# Patient Record
Sex: Female | Born: 1975
Health system: Southern US, Community
[De-identification: ages and names within clinical notes are randomized; demographics above are authoritative.]

## PROBLEM LIST (undated history)

## (undated) DIAGNOSIS — F32A Depression, unspecified: Secondary | ICD-10-CM

## (undated) DIAGNOSIS — Z872 Personal history of diseases of the skin and subcutaneous tissue: Secondary | ICD-10-CM

## (undated) DIAGNOSIS — F3281 Premenstrual dysphoric disorder: Secondary | ICD-10-CM

## (undated) DIAGNOSIS — Z87891 Personal history of nicotine dependence: Secondary | ICD-10-CM

## (undated) DIAGNOSIS — F419 Anxiety disorder, unspecified: Secondary | ICD-10-CM

## (undated) DIAGNOSIS — F329 Major depressive disorder, single episode, unspecified: Secondary | ICD-10-CM

## (undated) DIAGNOSIS — G43909 Migraine, unspecified, not intractable, without status migrainosus: Secondary | ICD-10-CM

## (undated) DIAGNOSIS — L858 Other specified epidermal thickening: Secondary | ICD-10-CM

## (undated) HISTORY — DX: Migraine, unspecified, not intractable, without status migrainosus: G43.909

## (undated) HISTORY — DX: Personal history of diseases of the skin and subcutaneous tissue: Z87.2

## (undated) HISTORY — DX: Personal history of nicotine dependence: Z87.891

## (undated) HISTORY — DX: Other specified epidermal thickening: L85.8

## (undated) HISTORY — DX: Premenstrual dysphoric disorder: F32.81

## (undated) HISTORY — PX: WISDOM TOOTH EXTRACTION: SHX21

---

## 2004-08-13 ENCOUNTER — Emergency Department (HOSPITAL_COMMUNITY): Admission: EM | Admit: 2004-08-13 | Discharge: 2004-08-13 | Payer: Self-pay | Admitting: Emergency Medicine

## 2005-08-31 ENCOUNTER — Ambulatory Visit: Payer: Self-pay | Admitting: Family Medicine

## 2005-09-09 ENCOUNTER — Encounter: Payer: Self-pay | Admitting: Family Medicine

## 2005-09-09 ENCOUNTER — Other Ambulatory Visit: Admission: RE | Admit: 2005-09-09 | Discharge: 2005-09-09 | Payer: Self-pay | Admitting: Family Medicine

## 2005-09-09 ENCOUNTER — Ambulatory Visit: Payer: Self-pay | Admitting: Family Medicine

## 2005-09-09 LAB — CONVERTED CEMR LAB: Pap Smear: NORMAL

## 2006-01-13 ENCOUNTER — Ambulatory Visit: Payer: Self-pay | Admitting: Family Medicine

## 2006-03-11 ENCOUNTER — Encounter: Payer: Self-pay | Admitting: Family Medicine

## 2006-10-08 ENCOUNTER — Encounter: Payer: Self-pay | Admitting: Family Medicine

## 2006-10-08 DIAGNOSIS — L708 Other acne: Secondary | ICD-10-CM | POA: Insufficient documentation

## 2006-10-08 DIAGNOSIS — Z87898 Personal history of other specified conditions: Secondary | ICD-10-CM | POA: Insufficient documentation

## 2006-10-08 DIAGNOSIS — Z87891 Personal history of nicotine dependence: Secondary | ICD-10-CM | POA: Insufficient documentation

## 2006-10-11 ENCOUNTER — Ambulatory Visit: Payer: Self-pay | Admitting: Family Medicine

## 2007-03-25 ENCOUNTER — Ambulatory Visit: Payer: Self-pay | Admitting: Family Medicine

## 2007-05-09 ENCOUNTER — Telehealth: Payer: Self-pay | Admitting: Family Medicine

## 2007-07-14 ENCOUNTER — Encounter: Payer: Self-pay | Admitting: Family Medicine

## 2007-07-14 ENCOUNTER — Other Ambulatory Visit: Admission: RE | Admit: 2007-07-14 | Discharge: 2007-07-14 | Payer: Self-pay | Admitting: Family Medicine

## 2007-07-14 ENCOUNTER — Ambulatory Visit: Payer: Self-pay | Admitting: Family Medicine

## 2007-07-14 DIAGNOSIS — F39 Unspecified mood [affective] disorder: Secondary | ICD-10-CM | POA: Insufficient documentation

## 2007-07-14 DIAGNOSIS — N920 Excessive and frequent menstruation with regular cycle: Secondary | ICD-10-CM | POA: Insufficient documentation

## 2007-07-18 LAB — CONVERTED CEMR LAB
Basophils Absolute: 0.1 10*3/uL (ref 0.0–0.1)
Basophils Relative: 0.6 % (ref 0.0–1.0)
Eosinophils Absolute: 0.2 10*3/uL (ref 0.0–0.6)
Eosinophils Relative: 1.8 % (ref 0.0–5.0)
HCT: 41.3 % (ref 36.0–46.0)
MCV: 91 fL (ref 78.0–100.0)
Monocytes Absolute: 0.5 10*3/uL (ref 0.2–0.7)
Neutro Abs: 6.9 10*3/uL (ref 1.4–7.7)
Platelets: 233 10*3/uL (ref 150–400)
RBC: 4.54 M/uL (ref 3.87–5.11)
WBC: 9.6 10*3/uL (ref 4.5–10.5)

## 2007-07-20 ENCOUNTER — Encounter (INDEPENDENT_AMBULATORY_CARE_PROVIDER_SITE_OTHER): Payer: Self-pay | Admitting: *Deleted

## 2008-01-06 ENCOUNTER — Telehealth (INDEPENDENT_AMBULATORY_CARE_PROVIDER_SITE_OTHER): Payer: Self-pay | Admitting: *Deleted

## 2008-03-05 ENCOUNTER — Ambulatory Visit: Payer: Self-pay | Admitting: Family Medicine

## 2008-07-19 ENCOUNTER — Telehealth: Payer: Self-pay | Admitting: Family Medicine

## 2008-07-24 ENCOUNTER — Encounter: Payer: Self-pay | Admitting: Family Medicine

## 2008-07-25 ENCOUNTER — Ambulatory Visit: Payer: Self-pay | Admitting: Family Medicine

## 2008-07-25 ENCOUNTER — Encounter: Payer: Self-pay | Admitting: Family Medicine

## 2008-07-25 ENCOUNTER — Other Ambulatory Visit: Admission: RE | Admit: 2008-07-25 | Discharge: 2008-07-25 | Payer: Self-pay | Admitting: Family Medicine

## 2008-07-25 DIAGNOSIS — N943 Premenstrual tension syndrome: Secondary | ICD-10-CM | POA: Insufficient documentation

## 2008-07-26 ENCOUNTER — Telehealth: Payer: Self-pay | Admitting: Family Medicine

## 2008-07-27 LAB — CONVERTED CEMR LAB
AST: 18 units/L (ref 0–37)
Albumin: 4.1 g/dL (ref 3.5–5.2)
Alkaline Phosphatase: 56 units/L (ref 39–117)
BUN: 10 mg/dL (ref 6–23)
Basophils Absolute: 0 10*3/uL (ref 0.0–0.1)
Basophils Relative: 0.3 % (ref 0.0–3.0)
Creatinine, Ser: 0.7 mg/dL (ref 0.4–1.2)
Eosinophils Relative: 0.8 % (ref 0.0–5.0)
HCT: 41 % (ref 36.0–46.0)
HDL: 64.8 mg/dL (ref >39.00)
Hemoglobin: 14.1 g/dL (ref 12.0–15.0)
Lymphocytes Relative: 35.4 % (ref 12.0–46.0)
MCV: 90.3 fL (ref 78.0–100.0)
Neutrophils Relative %: 58.9 % (ref 43.0–77.0)
Platelets: 242 10*3/uL (ref 150.0–400.0)
RBC: 4.54 M/uL (ref 3.87–5.11)
RDW: 11.7 % (ref 11.5–14.6)
TSH: 1.19 microintl units/mL (ref 0.35–5.50)
Total CHOL/HDL Ratio: 3
Total Protein: 7.7 g/dL (ref 6.0–8.3)
Triglycerides: 121 mg/dL (ref 0.0–149.0)
WBC: 7.4 10*3/uL (ref 4.5–10.5)

## 2008-07-30 ENCOUNTER — Encounter (INDEPENDENT_AMBULATORY_CARE_PROVIDER_SITE_OTHER): Payer: Self-pay | Admitting: *Deleted

## 2008-08-23 ENCOUNTER — Telehealth: Payer: Self-pay | Admitting: Family Medicine

## 2008-12-14 ENCOUNTER — Telehealth: Payer: Self-pay | Admitting: Family Medicine

## 2009-03-13 ENCOUNTER — Ambulatory Visit: Payer: Self-pay | Admitting: Family Medicine

## 2009-03-13 DIAGNOSIS — M26629 Arthralgia of temporomandibular joint, unspecified side: Secondary | ICD-10-CM | POA: Insufficient documentation

## 2009-04-17 ENCOUNTER — Telehealth: Payer: Self-pay | Admitting: Family Medicine

## 2009-05-21 ENCOUNTER — Telehealth: Payer: Self-pay | Admitting: Family Medicine

## 2009-07-26 ENCOUNTER — Ambulatory Visit: Payer: Self-pay | Admitting: Family Medicine

## 2009-07-26 ENCOUNTER — Other Ambulatory Visit: Admission: RE | Admit: 2009-07-26 | Discharge: 2009-07-26 | Payer: Self-pay | Admitting: Family Medicine

## 2009-07-26 LAB — HM PAP SMEAR

## 2009-08-01 ENCOUNTER — Encounter (INDEPENDENT_AMBULATORY_CARE_PROVIDER_SITE_OTHER): Payer: Self-pay | Admitting: *Deleted

## 2009-08-01 LAB — CONVERTED CEMR LAB: Pap Smear: NEGATIVE

## 2009-09-03 ENCOUNTER — Telehealth: Payer: Self-pay | Admitting: Family Medicine

## 2009-11-05 ENCOUNTER — Telehealth: Payer: Self-pay | Admitting: Family Medicine

## 2010-01-03 ENCOUNTER — Telehealth: Payer: Self-pay | Admitting: Family Medicine

## 2010-01-31 ENCOUNTER — Telehealth: Payer: Self-pay | Admitting: Family Medicine

## 2010-03-05 ENCOUNTER — Telehealth: Payer: Self-pay | Admitting: Family Medicine

## 2010-04-01 ENCOUNTER — Telehealth: Payer: Self-pay | Admitting: Family Medicine

## 2010-06-03 NOTE — Progress Notes (Signed)
SummaryPrudy Hamilton  Phone Note Refill Request Message from:  [phone] on January 31, 2010 4:29 PM  Refills Requested: Medication #1:  XANAX 0.5 MG TABS 1/2 to 1 by mouth once daily as needed severe anxiety.   Last Refilled: 01/03/2010  Method Requested: Telephone to Pharmacy Initial call taken by: Mervin Hack CMA Duncan Dull),  January 31, 2010 4:29 PM  Follow-up for Phone Call        px written on EMR for call in  Follow-up by: Judith Part MD,  January 31, 2010 4:39 PM  Additional Follow-up for Phone Call Additional follow up Details #1::        [facility]. Lewanda Rife LPN  January 31, 2010 5:01 PM     Prescriptions: XANAX 0.5 MG TABS (ALPRAZOLAM) 1/2 to 1 by mouth once daily as needed severe anxiety  #30 x 0   Entered and Authorized by:   Judith Part MD   Signed by:   Lewanda Rife LPN on 62/13/0865   Method used:   Telephoned to ...       [facility]       [address], [address]       [place], [place]  [address]       Ph: [phone]       Fax: [phone]   RxID:   [id number]

## 2010-06-03 NOTE — Progress Notes (Signed)
Summary: Rx Prozac  Phone Note Call from Patient Call back at [phone] ext [phone]   Caller: Patient Call For: Judith Part MD Summary of Call: Received call from patient.  She says when she went to pick up Rx for Prozac she had no further refills.  She comes in for a physical in March with Dr. Milinda Antis and wanted to know if she could have enough refills to last her until then.  Please advise.  [facility]ses [facility]. Initial call taken by: Linde Gillis CMA Duncan Dull),  May 21, 2009 12:52 PM  Follow-up for Phone Call        px written on EMR for call in  Follow-up by: Judith Part MD,  May 21, 2009 1:18 PM  Additional Follow-up for Phone Call Additional follow up Details #1::        [facility] Additional Follow-up by: Lowella Petties CMA,  May 21, 2009 3:39 PM    Prescriptions: PROZAC 20 MG CAPS (FLUOXETINE HCL) 1 by mouth once daily  #30 x 3   Entered and Authorized by:   Judith Part MD   Signed by:   Judith Part MD on 05/21/2009   Method used:   Telephoned to ...       [facility]       [address]       [place], [place]  [address]       Ph: [phone]       Fax: [phone]   RxID:   [id number]

## 2010-06-03 NOTE — Progress Notes (Signed)
Summary: refill request for xanax  Phone Note Refill Request Message from:  Fax from Pharmacy  Refills Requested: Medication #1:  XANAX 0.5 MG TABS 1/2 to 1 by mouth once daily as needed severe anxiety.   Last Refilled: 01/31/2010 [facility], [phone].  Initial call taken by: Lowella Petties CMA, AAMA,  March 05, 2010 10:59 AM  Follow-up for Phone Call        px written on EMR for call in  Follow-up by: Judith Part MD,  March 05, 2010 11:24 AM  Additional Follow-up for Phone Call Additional follow up Details #1::        Medication phoned to [facility] [address] [facility] as instructed. Lewanda Rife LPN  March 05, 2010 11:33 AM     Prescriptions: XANAX 0.5 MG TABS (ALPRAZOLAM) 1/2 to 1 by mouth once daily as needed severe anxiety  #30 x 0   Entered and Authorized by:   Judith Part MD   Signed by:   Lewanda Rife LPN on 13/24/4010   Method used:   Telephoned to ...       [facility] [address]       [address]       [place], [place]  [address]       Ph: [phone]       Fax: [phone]   RxID:   [id number]

## 2010-06-03 NOTE — Progress Notes (Signed)
Summary: refill request for xanax  Phone Note Refill Request Call back at Home Phone [phone] Message from:  Patient  Refills Requested: Medication #1:  XANAX 0.5 MG TABS 1/2 to 1 by mouth once daily as needed severe anxiety. Phoned request from pt, [facility]  Initial call taken by: Lowella Petties CMA,  Sep 03, 2009 3:22 PM  Follow-up for Phone Call        px written on EMR for call in  Follow-up by: Judith Part MD,  Sep 03, 2009 4:12 PM  Additional Follow-up for Phone Call Additional follow up Details #1::        [facility] [address] as instructed.  Patient notified as instructed by telephone. Lewanda Rife LPN  Sep 04, 2954 4:54 PM     Prescriptions: XANAX 0.5 MG TABS (ALPRAZOLAM) 1/2 to 1 by mouth once daily as needed severe anxiety  #30 x 1   Entered and Authorized by:   Judith Part MD   Signed by:   Lewanda Rife LPN on 21/30/8657   Method used:   Telephoned to ...       [facility] [address]       [address]       [place], [place]  [address]       Ph: [phone]       Fax: [phone]   RxID:   [id number]

## 2010-06-03 NOTE — Progress Notes (Signed)
Summary: Alprazolam 0.5mg   Phone Note Refill Request Call back at [phone] Message from:  [facility] on April 01, 2010 10:15 AM  Refills Requested: Medication #1:  XANAX 0.5 MG TABS 1/2 to 1 by mouth once daily as needed severe anxiety.   Last Refilled: 03/05/2010 [facility] [address] electronically request refill on alprazolam 0.5mg . Last refilled 03/05/10.Please advise.     Method Requested: Telephone to Pharmacy Initial call taken by: Lewanda Rife LPN,  April 01, 2010 10:16 AM  Follow-up for Phone Call        px written on EMR for call in  Follow-up by: Judith Part MD,  April 01, 2010 10:20 AM  Additional Follow-up for Phone Call Additional follow up Details #1::        [facility] [address] as instructed. Lewanda Rife LPN  April 01, 2010 10:25 AM     Prescriptions: XANAX 0.5 MG TABS (ALPRAZOLAM) 1/2 to 1 by mouth once daily as needed severe anxiety  #30 x 3   Entered and Authorized by:   Judith Part MD   Signed by:   Lewanda Rife LPN on 84/69/6295   Method used:   Telephoned to ...       [facility] [address]       [address], [address]       [place], [place]  [address]       Ph: [phone]       Fax: [phone]   RxID:   [id number]

## 2010-06-03 NOTE — Assessment & Plan Note (Signed)
Summary: CPX/DLO   Vital Signs:  Patient profile:   35 year old female Height:      64 inches Weight:      148.50 pounds BMI:     25.58 Temp:     98.3 degrees F oral Pulse rate:   80 / minute Pulse rhythm:   regular BP sitting:   102 / 64  (left arm) Cuff size:   regular  Vitals Entered By: Linde Gillis CMA Duncan Dull) (July 26, 2009 10:27 AM) CC: 30 minute exam   History of Present Illness: here for wellness exam / gyn  has been feeling good  no problems    wt is up 2 lb with bmi of 25 she is trying to fight wt gain with diet and exercise  bp good 102/64  mother had hx of colitis   pap nl 3/10 on yaz-- wants to stay on that  likes it -- periods are short and predictable and no more cramps  no gyn symptoms  no  worries about stds   td 07 did get a flu shot at work   prozac works very well   is eating a healthy diet and getting exercise   Allergies (verified): No Known Drug Allergies  Past History:  Past Medical History: Last updated: 07/25/2008 acne- s/p tx with accutane migraines PMDD former tab abuse pillar keratosis  Family History: Last updated: 03/05/2008 Father:  Mother: colon polyps , and colitis  Siblings:  GM with female aunt cancer lymphoma GF cancer kind   Social History: Last updated: 07/26/2009 Marital Status: married  Children: 1 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 Eyes:  Denies blurring and eye irritation. CV:  Denies chest pain or discomfort, lightheadness, and palpitations. Resp:  Denies cough and wheezing. GI:  Denies abdominal  pain, bloody stools, change in bowel habits, and indigestion. GU:  Denies abnormal vaginal bleeding, discharge, dysuria, and urinary frequency. MS:  Denies joint pain, joint redness, and joint swelling. Derm:  Denies lesion(s), poor wound healing, and rash. Neuro:  Denies headaches, numbness, and tingling. Psych:  Denies anxiety and depression. Endo:  Denies cold intolerance, excessive thirst, excessive urination, and heat intolerance. Heme:  Denies abnormal bruising and bleeding.  Physical Exam  General:  Well-developed,well-nourished,in no acute distress; alert,appropriate and cooperative throughout examination Head:  normocephalic, atraumatic, and no abnormalities observed.   Eyes:  vision grossly intact, pupils equal, pupils round, and pupils reactive to light.  no conjunctival pallor, injection or icterus  Ears:  R ear normal and L ear normal.   Nose:  no nasal discharge.   Mouth:  pharynx pink and moist.   Neck:  supple with full rom and no masses or thyromegally, no JVD or carotid bruit  Chest Wall:  No deformities, masses, or tenderness noted. Breasts:  No mass, nodules, thickening, tenderness, bulging, retraction, inflamation, nipple discharge or skin changes noted.   Lungs:  Normal respiratory effort, chest expands symmetrically. Lungs are clear to auscultation, no crackles or wheezes. Heart:  Normal rate and regular rhythm. S1 and S2 normal without gallop, murmur, click, rub or other extra sounds. Abdomen:  Bowel sounds positive,abdomen  soft and non-tender without masses, organomegaly or hernias noted. no renal bruits  Genitalia:  Normal introitus for age, no external lesions, no vaginal discharge, mucosa pink and moist, no vaginal or cervical lesions, no vaginal atrophy, no friaility or hemorrhage, normal uterus size and position, no adnexal masses or tenderness Msk:  No deformity or scoliosis noted of thoracic or lumbar spine.  no acute joint changes  Pulses:  R and L  carotid,radial,femoral,dorsalis pedis and posterior tibial pulses are full and equal bilaterally Extremities:  No clubbing, cyanosis, edema, or deformity noted with normal full range of motion of all joints.   Neurologic:  sensation intact to light touch, gait normal, and DTRs symmetrical and normal.   Skin:  Intact without suspicious lesions or rashes some lentigos and stable brown nevi Cervical Nodes:  No lymphadenopathy noted Axillary Nodes:  No palpable lymphadenopathy Inguinal Nodes:  No significant adenopathy Psych:  normal affect, talkative and pleasant    Impression & Recommendations:  Problem # 1:  HEALTH MAINTENANCE EXAM (ICD-V70.0) Assessment Comment Only reviewed health habits including diet, exercise and skin cancer prevention reviewed health maintenance list and family history commended on good heatlh habits rev last years labs - high HDL is good   Problem # 2:  ROUTINE GYNECOLOGICAL EXAMINATION (ICD-V72.31) Assessment: Comment Only  no problems on yaz- will continue that  is still a non amoker  Orders: Prescription Created Electronically [phone])  Problem # 3:  MOOD SWINGS (ICD-296.99) Assessment: Improved  doing well on prozac -plan to continue this  urged to keep up wth the good diet and exercise   Orders: Prescription Created Electronically [phone])  Complete Medication List: 1)  Yaz 3-0.02 Mg Tabs (Drospirenone-ethinyl estradiol) .... Take 1 by mouth once daily as directed 2)  Prozac 20 Mg Caps (Fluoxetine hcl) .Marland Kitchen.. 1 by mouth once daily 3)  Xanax 0.5 Mg Tabs (Alprazolam) .... 1/2 to 1 by mouth once daily as needed severe anxiety  Patient Instructions: 1)  keep up the good work with diet and exercise 2)  no medicine changes  Prescriptions: XANAX 0.5 MG TABS (ALPRAZOLAM) 1/2 to 1 by mouth once daily as needed severe anxiety  #30 x 0   Entered and Authorized by:   Judith Part MD   Signed by:   Judith Part MD on 07/26/2009   Method used:   Print  then Give to Patient   RxID:   [id number] PROZAC 20 MG CAPS (FLUOXETINE HCL) 1 by mouth once daily  #30 x 11   Entered and Authorized by:   Judith Part MD   Signed by:   Judith Part MD on 07/26/2009   Method used:   [facility] to        [facility] [address]       [address]       [place], [place]  [address]       Ph: [phone]       Fax: [phone]   RxID:   [id number] YAZ 3-0.02 MG TABS (DROSPIRENONE-ETHINYL ESTRADIOL) take 1 by mouth once daily as directed  #1 pack x 11   Entered and Authorized by:   Judith Part MD   Signed by:   Judith Part MD on 07/26/2009   Method used:   [facility]ronically to        [facility] [address]       [phone]  [address]       [place], [place]  [address]       Ph: [phone]       Fax: [phone]   RxID:   [id number]   Current Allergies (reviewed today): No known allergies

## 2010-06-03 NOTE — Letter (Signed)
Summary: Results Follow up Letter  [facility]  [address] [place], [place] [address]   Phone: [phone]  Fax: [phone]    08/01/2009 MRN: [id number]   [facility] [address] [place], [place]  [address]  Dear Ms. Ringle,  The following are the results of your recent test(s):  Test         Result    Pap Smear:        Normal __X___  Not Normal _____ Comments:Repeat in 1 year ______________________________________________________ Cholesterol: LDL(Bad cholesterol):         Your goal is less than:         HDL (Good cholesterol):       Your goal is more than: Comments:  ______________________________________________________ Mammogram:        Normal _____  Not Normal _____ Comments:  ___________________________________________________________________ Hemoccult:        Normal _____  Not normal _______ Comments:    _____________________________________________________________________ Other Tests:    We routinely do not discuss normal results over the telephone.  If you desire a copy of the results, or you have any questions about this information we can discuss them at your next office visit.   Sincerely,  Roxy Manns MD

## 2010-06-03 NOTE — Progress Notes (Signed)
Summary: Alprazolam  Phone Note Refill Request Message from:  [facility] on November 05, 2009 12:24 PM  Refills Requested: Medication #1:  XANAX 0.5 MG TABS 1/2 to 1 by mouth once daily as needed severe anxiety. [facility] [address]   Last Fill Date:  10/06/2009   Pharmacy Phone:  [phone]   Method Requested: Telephone to Pharmacy Initial call taken by: Delilah Shan CMA Duncan Dull),  November 05, 2009 12:24 PM  Follow-up for Phone Call        px written on EMR for call in  Follow-up by: Judith Part MD,  November 05, 2009 1:25 PM  Additional Follow-up for Phone Call Additional follow up Details #1::        [facility]. Lewanda Rife LPN  November 06, 1658 4:36 PM     Prescriptions: XANAX 0.5 MG TABS (ALPRAZOLAM) 1/2 to 1 by mouth once daily as needed severe anxiety  #30 x 1   Entered and Authorized by:   Judith Part MD   Signed by:   Lewanda Rife LPN on 63/05/6008   Method used:   Telephoned to ...       [facility] [address]       [address]       [place], [place]  [address]       Ph: [phone]       Fax: [phone]   RxID:   [id number]

## 2010-06-03 NOTE — Progress Notes (Signed)
Summary: refill request for xanax  Phone Note Refill Request Message from:  [facility]  Refills Requested: Medication #1:  XANAX 0.5 MG TABS 1/2 to 1 by mouth once daily as needed severe anxiety.   Last Refilled: 12/02/2009 [facility]  Initial call taken by: Lowella Petties CMA,  January 03, 2010 9:10 AM  Follow-up for Phone Call        done.  please call in. Follow-up by: Eustaquio Boyden  MD,  January 03, 2010 12:54 PM  Additional Follow-up for Phone Call Additional follow up Details #1::        [facility] [address]. Lewanda Rife LPN  January 03, 2010 1:11 PM     Prescriptions: XANAX 0.5 MG TABS (ALPRAZOLAM) 1/2 to 1 by mouth once daily as needed severe anxiety  #30 x 0   Entered and Authorized by:   Eustaquio Boyden  MD   Signed by:   Lewanda Rife LPN on 16/02/9603   Method used:   Telephoned to ...       [facility] [address]       [address], [address]       [place], [place]  [address]       Ph: [phone]       Fax: [phone]   RxID:   [id number]

## 2010-07-07 ENCOUNTER — Encounter: Payer: Self-pay | Admitting: Family Medicine

## 2010-07-07 DIAGNOSIS — F3281 Premenstrual dysphoric disorder: Secondary | ICD-10-CM | POA: Insufficient documentation

## 2010-07-07 DIAGNOSIS — Z87891 Personal history of nicotine dependence: Secondary | ICD-10-CM | POA: Insufficient documentation

## 2010-07-07 DIAGNOSIS — Z872 Personal history of diseases of the skin and subcutaneous tissue: Secondary | ICD-10-CM

## 2010-07-07 DIAGNOSIS — L858 Other specified epidermal thickening: Secondary | ICD-10-CM

## 2010-07-07 DIAGNOSIS — G43909 Migraine, unspecified, not intractable, without status migrainosus: Secondary | ICD-10-CM

## 2010-08-01 ENCOUNTER — Other Ambulatory Visit: Payer: Self-pay | Admitting: Family Medicine

## 2010-08-01 NOTE — Telephone Encounter (Signed)
Medication phoned to [facility] [address] pharmacy as instructed.  

## 2010-08-01 NOTE — Telephone Encounter (Signed)
[facility] [address] electronically request refill for Xanax.Please advise.

## 2010-08-01 NOTE — Telephone Encounter (Signed)
Please call in, then notify patient.

## 2010-08-04 ENCOUNTER — Other Ambulatory Visit: Payer: Self-pay | Admitting: Family Medicine

## 2010-08-13 ENCOUNTER — Telehealth: Payer: Self-pay | Admitting: Family Medicine

## 2010-08-13 DIAGNOSIS — Z0000 Encounter for general adult medical examination without abnormal findings: Secondary | ICD-10-CM | POA: Insufficient documentation

## 2010-08-13 NOTE — Telephone Encounter (Signed)
Message copied by Roxy Manns on Wed Aug 13, 2010 10:16 AM ------      Message from: Margarite Gouge, NATASHA      Created: Wed Aug 13, 2010  7:25 AM      Regarding: Cpx  Labs thurs       Please order  future cpx labs for pt's upcomming lab appt.      Thanks      Rodney Booze

## 2010-08-13 NOTE — Telephone Encounter (Signed)
Message copied by Roxy Manns on Wed Aug 13, 2010 10:17 AM ------      Message from: Margarite Gouge, NATASHA      Created: Wed Aug 13, 2010  7:25 AM      Regarding: Cpx  Labs thurs       Please order  future cpx labs for pt's upcomming lab appt.      Thanks      Rodney Booze

## 2010-08-14 ENCOUNTER — Other Ambulatory Visit (INDEPENDENT_AMBULATORY_CARE_PROVIDER_SITE_OTHER): Payer: PRIVATE HEALTH INSURANCE

## 2010-08-14 DIAGNOSIS — Z0000 Encounter for general adult medical examination without abnormal findings: Secondary | ICD-10-CM

## 2010-08-14 LAB — CBC WITH DIFFERENTIAL/PLATELET
Basophils Relative: 0.6 % (ref 0.0–3.0)
Eosinophils Absolute: 0.2 10*3/uL (ref 0.0–0.7)
Hemoglobin: 14.9 g/dL (ref 12.0–15.0)
Monocytes Relative: 7.2 % (ref 3.0–12.0)
Neutro Abs: 4.3 10*3/uL (ref 1.4–7.7)
Neutrophils Relative %: 56.3 % (ref 43.0–77.0)
RBC: 4.67 Mil/uL (ref 3.87–5.11)

## 2010-08-14 LAB — COMPREHENSIVE METABOLIC PANEL
ALT: 33 U/L (ref 0–35)
Alkaline Phosphatase: 74 U/L (ref 39–117)
CO2: 28 mEq/L (ref 19–32)
Calcium: 9.4 mg/dL (ref 8.4–10.5)
Chloride: 101 mEq/L (ref 96–112)
Creatinine, Ser: 0.6 mg/dL (ref 0.4–1.2)
Glucose, Bld: 101 mg/dL — ABNORMAL HIGH (ref 70–99)
Potassium: 5.1 mEq/L (ref 3.5–5.1)
Sodium: 137 mEq/L (ref 135–145)

## 2010-08-14 LAB — LIPID PANEL
Cholesterol: 240 mg/dL — ABNORMAL HIGH (ref 0–200)
HDL: 63.9 mg/dL (ref >39.00)
Total CHOL/HDL Ratio: 4

## 2010-08-14 LAB — LDL CHOLESTEROL, DIRECT: Direct LDL: 163.5 mg/dL

## 2010-08-21 ENCOUNTER — Encounter: Payer: Self-pay | Admitting: Family Medicine

## 2010-08-26 ENCOUNTER — Encounter: Payer: Self-pay | Admitting: Family Medicine

## 2010-08-26 ENCOUNTER — Other Ambulatory Visit (HOSPITAL_COMMUNITY)
Admission: RE | Admit: 2010-08-26 | Discharge: 2010-08-26 | Disposition: A | Discharge status: Home / Self Care | Payer: PRIVATE HEALTH INSURANCE | Source: Ambulatory Visit | Attending: Family Medicine | Admitting: Family Medicine

## 2010-08-26 ENCOUNTER — Ambulatory Visit (INDEPENDENT_AMBULATORY_CARE_PROVIDER_SITE_OTHER): Payer: PRIVATE HEALTH INSURANCE | Admitting: Family Medicine

## 2010-08-26 DIAGNOSIS — F419 Anxiety disorder, unspecified: Secondary | ICD-10-CM

## 2010-08-26 DIAGNOSIS — Z1159 Encounter for screening for other viral diseases: Secondary | ICD-10-CM | POA: Insufficient documentation

## 2010-08-26 DIAGNOSIS — Z01419 Encounter for gynecological examination (general) (routine) without abnormal findings: Secondary | ICD-10-CM | POA: Insufficient documentation

## 2010-08-26 DIAGNOSIS — N920 Excessive and frequent menstruation with regular cycle: Secondary | ICD-10-CM

## 2010-08-26 DIAGNOSIS — N943 Premenstrual tension syndrome: Secondary | ICD-10-CM

## 2010-08-26 DIAGNOSIS — E785 Hyperlipidemia, unspecified: Secondary | ICD-10-CM

## 2010-08-26 DIAGNOSIS — Z0000 Encounter for general adult medical examination without abnormal findings: Secondary | ICD-10-CM

## 2010-08-26 DIAGNOSIS — F411 Generalized anxiety disorder: Secondary | ICD-10-CM | POA: Insufficient documentation

## 2010-08-26 DIAGNOSIS — M26629 Arthralgia of temporomandibular joint, unspecified side: Secondary | ICD-10-CM

## 2010-08-26 MED ORDER — FLUOXETINE HCL 40 MG PO CAPS: 40.0000 mg | ORAL_CAPSULE | Freq: Every day | ORAL | Status: DC

## 2010-08-26 MED ORDER — ALPRAZOLAM 0.5 MG PO TABS: 0.5000 mg | ORAL_TABLET | Freq: Every day | ORAL | Status: DC | PRN

## 2010-08-26 NOTE — Assessment & Plan Note (Signed)
Worse with stress with some jaw popping  Adv to f/u with dentist to disc a bite plate

## 2010-08-26 NOTE — Assessment & Plan Note (Signed)
Reviewed health habits including diet and exercise and skin cancer prevention Also reviewed health mt list, fam hx and immunizations  Rev wellness labs in detail Disc cholesterol

## 2010-08-26 NOTE — Assessment & Plan Note (Signed)
pmdd has bloomed into full blown month long anxiety Some is stress related Offered counseling- declined Disc coping tech Urged imp of weaning off xanax Inc prozac to 40 mg - disc side eff to watch for / or if worse/ dep or anx F/u 3 mo  Slowly wean xanax as able Keep up exercise >25 min spent with face to face with patient counseling and coordinating care

## 2010-08-26 NOTE — Progress Notes (Signed)
Subjective:    Patient ID: Laura Hamilton, female    DOB: 09-28-1975, 35 y.o.   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  Worried about loosing her job    Menses - heavy in past -- now very manageble with the yaz No problems - no pain  Needs pap today No hx of abn paps  Does not want std tests    Wt is down 3 lb with good bmi of 25 Is trying to eat healthy and exercise (considering the P90X)  Td 07   Cholesterol is high-- LDL went from 130s to 160s  No one in the family with high cholesterol  Diet is fairly good  Beef 3 times per week , very rarely eats any fried foods , no butter , not a lot of egg yolks (2-3 times per month) , does not like sweets  Some microwave popcorn , drinks skim milk- no high fat dairly - and bacon or sausage , no cheese   Past Medical History  Diagnosis Date  . History of acne     s/p tx with accutane  . Migraines   . PMDD (premenstrual dysphoric disorder)   . Keratosis pilaris   . Personal history of tobacco use    No past surgical history on file.  reports that she has quit smoking. She does not have any smokeless tobacco history on file. She reports that she drinks alcohol. Her drug history not on file. family history includes Cancer in an unspecified family member; Colitis in her mother; and Colon polyps in her mother. No Known Allergies    Review of Systems  Review of Systems  Constitutional: Negative for fever, appetite change, fatigue and unexpected weight change.  Eyes: Negative for  pain and visual disturbance.  Respiratory: Negative for cough and shortness of breath.   Cardiovascular: Negative.   Gastrointestinal: Negative for nausea, diarrhea and constipation.  Genitourinary: Negative for urgency and frequency. , neg for vag d/c or pelvic pain  Skin: Negative for pallor.  Neurological: Negative for weakness, light-headedness, numbness and headaches.  Hematological: Negative for adenopathy. Does not bruise/bleed easily.  Psychiatric/Behavioral: Negative for dysphoric mood. Pos for anxiety and panic symptoms         Objective:   Physical Exam  Constitutional: She appears well-developed and well-nourished. No distress.  HENT:  Head: Normocephalic and atraumatic.  Right Ear: External ear normal.  Left Ear: External ear normal.  Nose: Nose normal.  Mouth/Throat: Oropharynx is clear and moist.  Eyes: Conjunctivae and EOM are normal. Pupils are equal, round, and reactive to light.  Neck: Normal range of motion. Neck supple. No JVD present. Carotid bruit is not present. No thyromegaly present.  Cardiovascular: Normal rate, regular rhythm and normal heart sounds.   Pulmonary/Chest: Effort normal and breath sounds normal. She has no wheezes.  Abdominal: Soft. Bowel sounds are normal. She exhibits no mass. There is no tenderness.  Genitourinary: Vagina normal and uterus normal.  No breast swelling, tenderness, discharge or bleeding. No vaginal discharge found.  Musculoskeletal: Normal range of motion. She exhibits no edema and no tenderness.  Lymphadenopathy:    She has no cervical adenopathy.  Neurological: She has normal reflexes. Coordination normal.  Skin: Skin is warm and dry. No rash noted. No erythema. No pallor.  Psychiatric: She has a normal mood and affect.       Does not seem overly anxious today No pressured speech Good eye contact and comm skills           Assessment & Plan:

## 2010-08-26 NOTE — Patient Instructions (Signed)
See your dentist about the TMJ Increase prozac to 40 - update me if any side effects or problems Gradually try to decrease xanax as your anxiety improves  Avoid red meat/ fried foods/ egg yolks/ fatty breakfast meats/ butter, cheese and high fat dairy/ and shellfish   Schedule fasting lab in 3 months for lipid panel  Follow up with me after that

## 2010-08-26 NOTE — Assessment & Plan Note (Signed)
Annual exam with pap  Monogamous Doing well on yaz Periods are well controlled

## 2010-08-26 NOTE — Assessment & Plan Note (Signed)
Overall some imp with Yaz See disc of anx

## 2010-08-26 NOTE — Assessment & Plan Note (Signed)
This is new with LDL in 160s Rev diet in detail- red meat may be the problem Unsure of fam hx  Disc risks of high chol Goal LDL is 130  HDL is good Disc low sat fat diet and exercise Re check 3 mo and f/u

## 2010-09-09 ENCOUNTER — Encounter: Payer: Self-pay | Admitting: *Deleted

## 2010-09-23 ENCOUNTER — Other Ambulatory Visit: Payer: Self-pay | Admitting: Family Medicine

## 2010-09-24 ENCOUNTER — Other Ambulatory Visit: Payer: Self-pay | Admitting: Family Medicine

## 2010-09-24 NOTE — Telephone Encounter (Signed)
Medication phoned to [facility] [address] pharmacy as instructed.  

## 2010-09-24 NOTE — Telephone Encounter (Signed)
Px written for call in   

## 2010-10-22 ENCOUNTER — Other Ambulatory Visit: Payer: Self-pay | Admitting: Family Medicine

## 2010-10-22 NOTE — Telephone Encounter (Signed)
Is it okay to refill this medication?

## 2010-10-23 NOTE — Telephone Encounter (Signed)
Rx called to pharmacy as instructed. 

## 2010-10-23 NOTE — Telephone Encounter (Signed)
Px written for call in   

## 2010-11-27 ENCOUNTER — Other Ambulatory Visit: Payer: PRIVATE HEALTH INSURANCE

## 2010-12-01 ENCOUNTER — Ambulatory Visit: Payer: PRIVATE HEALTH INSURANCE | Admitting: Family Medicine

## 2010-12-29 ENCOUNTER — Ambulatory Visit: Payer: PRIVATE HEALTH INSURANCE | Admitting: Family Medicine

## 2011-02-16 ENCOUNTER — Other Ambulatory Visit: Payer: Self-pay | Admitting: Family Medicine

## 2011-02-17 NOTE — Telephone Encounter (Signed)
Rx called in as directed.   

## 2011-02-17 NOTE — Telephone Encounter (Signed)
Px written for call in   

## 2011-02-17 NOTE — Telephone Encounter (Signed)
Ok to refill? Last saw you in April with no upcoming appts.

## 2011-04-26 ENCOUNTER — Other Ambulatory Visit: Payer: Self-pay | Admitting: Family Medicine

## 2011-04-27 ENCOUNTER — Other Ambulatory Visit: Payer: Self-pay | Admitting: Family Medicine

## 2011-04-27 NOTE — Telephone Encounter (Signed)
Medication phoned to [facility] [address] pharmacy as instructed.

## 2011-04-27 NOTE — Telephone Encounter (Signed)
Px written for call in   

## 2011-04-27 NOTE — Telephone Encounter (Signed)
[facility] [address] request refill Alprazolam 0.5 mg. Last refilled 03/28/11 and pt last seen 08/26/10.

## 2011-05-15 ENCOUNTER — Telehealth: Payer: Self-pay | Admitting: Family Medicine

## 2011-05-25 ENCOUNTER — Other Ambulatory Visit: Payer: Self-pay | Admitting: Family Medicine

## 2011-05-26 NOTE — Telephone Encounter (Signed)
[facility] [address] request refill Alprazolam 0.5mg . Pt last seen 08/28/10 and no future appts scheduled.Please advise.

## 2011-05-26 NOTE — Telephone Encounter (Signed)
Medication phoned to [facility] [address] pharmacy as instructed. Will send note to [facility] to call pt about scheduling appt. Thank you.

## 2011-05-26 NOTE — Telephone Encounter (Signed)
Left message for pt to call back to schedule a f/u or CPE

## 2011-05-26 NOTE — Telephone Encounter (Signed)
Schedule f/u in April please (PE if she wants it) Px written for call in

## 2011-06-10 ENCOUNTER — Telehealth: Payer: Self-pay | Admitting: Family Medicine

## 2011-06-10 NOTE — Telephone Encounter (Signed)
Per Bo Merino, document justify the charges billed.  Informed patient, need additional clarification. Told pt I would have the office manager call her back on tomorrow at her work # [phone].

## 2011-06-10 NOTE — Telephone Encounter (Signed)
Pt called, question the level charge for [facility] 08-26-2010. Says she only had a Physical.  Told pt I will have the audit dept to review. Told pt I would be back in contact w/ her.Marland Kitchen

## 2011-06-11 NOTE — Telephone Encounter (Signed)
Spoke with patient and explained that it had been audited and that the charges are valid.  I explained documentation and coding/billing and that the charges were correct per the audit.  We also discussed that the changes may be due to her insurance coverage changed and/or the requirements for meeting co-pays and deductibles.   The patient said that she has changed insurance a few times over the years and that she had not ever had to pay or meet a deductible.  Patient would like further explanation and written documentation of why the charges did not fall within a physical for this visit.  I will contact Geri for further detailed information for the patient.

## 2011-06-29 ENCOUNTER — Telehealth: Payer: Self-pay | Admitting: Family Medicine

## 2011-06-29 DIAGNOSIS — Z0000 Encounter for general adult medical examination without abnormal findings: Secondary | ICD-10-CM

## 2011-06-29 DIAGNOSIS — E785 Hyperlipidemia, unspecified: Secondary | ICD-10-CM

## 2011-06-29 NOTE — Telephone Encounter (Signed)
Message copied by Judy Pimple on Mon Jun 29, 2011  9:33 PM ------      Message from: Alvina Chou      Created: Mon Jun 29, 2011  3:34 PM      Regarding: Lab orders for 2.26.2013       Patient is scheduled for CPX labs, please order future labs, Thanks , Camelia Eng

## 2011-06-30 ENCOUNTER — Other Ambulatory Visit (INDEPENDENT_AMBULATORY_CARE_PROVIDER_SITE_OTHER): Payer: PRIVATE HEALTH INSURANCE

## 2011-06-30 ENCOUNTER — Telehealth: Payer: Self-pay | Admitting: Family Medicine

## 2011-06-30 DIAGNOSIS — E785 Hyperlipidemia, unspecified: Secondary | ICD-10-CM

## 2011-06-30 DIAGNOSIS — Z0000 Encounter for general adult medical examination without abnormal findings: Secondary | ICD-10-CM

## 2011-06-30 LAB — CBC WITH DIFFERENTIAL/PLATELET
Basophils Absolute: 0.1 10*3/uL (ref 0.0–0.1)
Eosinophils Relative: 1.4 % (ref 0.0–5.0)
HCT: 42 % (ref 36.0–46.0)
Hemoglobin: 13.9 g/dL (ref 12.0–15.0)
MCHC: 33.2 g/dL (ref 30.0–36.0)
MCV: 93.4 fl (ref 78.0–100.0)
Monocytes Absolute: 0.7 10*3/uL (ref 0.1–1.0)
Neutrophils Relative %: 67.7 % (ref 43.0–77.0)
Platelets: 221 10*3/uL (ref 150.0–400.0)
WBC: 10.9 10*3/uL — ABNORMAL HIGH (ref 4.5–10.5)

## 2011-06-30 LAB — COMPREHENSIVE METABOLIC PANEL
ALT: 11 U/L (ref 0–35)
AST: 15 U/L (ref 0–37)
Albumin: 3.8 g/dL (ref 3.5–5.2)
Calcium: 8.6 mg/dL (ref 8.4–10.5)
Chloride: 104 mEq/L (ref 96–112)
Sodium: 136 mEq/L (ref 135–145)
Total Bilirubin: 0.2 mg/dL — ABNORMAL LOW (ref 0.3–1.2)
Total Protein: 6.6 g/dL (ref 6.0–8.3)

## 2011-06-30 LAB — LIPID PANEL
HDL: 47.9 mg/dL (ref >39.00)
LDL Cholesterol: 120 mg/dL — ABNORMAL HIGH (ref 0–99)

## 2011-07-06 NOTE — Telephone Encounter (Signed)
Thanks

## 2011-07-06 NOTE — Telephone Encounter (Signed)
Have attempted to call patient several times with updates at both work and home/cell phone number.  The person at work number said that she was not at that number any longer and couldn't provide new contact number.  There was no answer at home/cell phone number and I've left messages for patient to return the call.  The last message left explained that there would be adjustments to account per conversation with Dr. Milinda Antis and that I'd like a chance to speak with the patient after her appointment to update her and apologize for any inconvenience.  The account has been taken care of and she should no longer receive bills or letters regarding it.

## 2011-07-07 ENCOUNTER — Ambulatory Visit (INDEPENDENT_AMBULATORY_CARE_PROVIDER_SITE_OTHER): Payer: BC Managed Care – PPO | Admitting: Family Medicine

## 2011-07-07 ENCOUNTER — Encounter: Payer: Self-pay | Admitting: Family Medicine

## 2011-07-07 ENCOUNTER — Ambulatory Visit (INDEPENDENT_AMBULATORY_CARE_PROVIDER_SITE_OTHER)
Admission: RE | Admit: 2011-07-07 | Discharge: 2011-07-07 | Disposition: A | Discharge status: Home / Self Care | Payer: BC Managed Care – PPO | Source: Ambulatory Visit | Attending: Family Medicine | Admitting: Family Medicine

## 2011-07-07 ENCOUNTER — Other Ambulatory Visit (HOSPITAL_COMMUNITY)
Admission: RE | Admit: 2011-07-07 | Discharge: 2011-07-07 | Disposition: A | Discharge status: Home / Self Care | Payer: BC Managed Care – PPO | Source: Ambulatory Visit | Attending: Family Medicine | Admitting: Family Medicine

## 2011-07-07 VITALS — BP 92/64 | HR 70 | Temp 98.0°F | Ht 64.0 in | Wt 162.8 lb

## 2011-07-07 DIAGNOSIS — R05 Cough: Secondary | ICD-10-CM

## 2011-07-07 DIAGNOSIS — F419 Anxiety disorder, unspecified: Secondary | ICD-10-CM

## 2011-07-07 DIAGNOSIS — Z01419 Encounter for gynecological examination (general) (routine) without abnormal findings: Secondary | ICD-10-CM

## 2011-07-07 DIAGNOSIS — Z0000 Encounter for general adult medical examination without abnormal findings: Secondary | ICD-10-CM

## 2011-07-07 DIAGNOSIS — R059 Cough, unspecified: Secondary | ICD-10-CM

## 2011-07-07 DIAGNOSIS — E785 Hyperlipidemia, unspecified: Secondary | ICD-10-CM

## 2011-07-07 DIAGNOSIS — F411 Generalized anxiety disorder: Secondary | ICD-10-CM

## 2011-07-07 MED ORDER — HYDROCOD POLST-CHLORPHEN POLST 10-8 MG/5ML PO LQCR: 5.0000 mL | Freq: Two times a day (BID) | ORAL | Status: DC | PRN

## 2011-07-07 MED ORDER — DROSPIRENONE-ETHINYL ESTRADIOL 3-0.02 MG PO TABS: 1.0000 | ORAL_TABLET | Freq: Every day | ORAL | Status: DC

## 2011-07-07 MED ORDER — FLUOXETINE HCL 20 MG PO TABS: 20.0000 mg | ORAL_TABLET | Freq: Every day | ORAL | Status: DC

## 2011-07-07 NOTE — Assessment & Plan Note (Signed)
Reviewed health habits including diet and exercise and skin cancer prevention Also reviewed health mt list, fam hx and immunizations   Rev wellness labs in detail Made plan for exercise

## 2011-07-07 NOTE — Assessment & Plan Note (Signed)
Gyn exam with pap today Doing ok with OC

## 2011-07-07 NOTE — Assessment & Plan Note (Signed)
Improved with better diet  Disc goals for lipids and reasons to control them Rev labs with pt Rev low sat fat diet in detail  commended

## 2011-07-07 NOTE — Telephone Encounter (Signed)
Patient returned call and she was informed that account was taken care of.

## 2011-07-07 NOTE — Assessment & Plan Note (Signed)
Doing better Cutting her prozac to 20 mg to see how she does Will update

## 2011-07-07 NOTE — Assessment & Plan Note (Signed)
For several mo in former smoker after uri No gerd symptoms slt inc in wbc cxr today tussionex to break cough cycle

## 2011-07-07 NOTE — Progress Notes (Signed)
Subjective:    Patient ID: Laura Hamilton, female    DOB: Sep 19, 1975, 36 y.o.   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 If a little wheeze  During the day is just dry  No acid reflux symptoms at all    Last pap was 4/12- normal  Menses normal and regular -not painful or heavy Is on yaz- still on it , no plans for pregnancy for at least 2 years  Hx of PMDD  Flu shot - did not get - changed jobs   Wt gain of 17 lb since last visit with bmi of 27 Is eating heatlhy -- saw her wt gain and immediately worked on getting it off   Mood has been good - and wants to go ahead and wean the prozac back down to 20 mg  Does not crave sugar  Uses xanax - one pill every other day- tries to use only if really needed      Wellness labs   Lipids  improved from last year Lab Results  Component Value Date   CHOL 186 06/30/2011   CHOL 240* 08/14/2010   CHOL 217* 07/25/2008   Lab Results  Component Value Date   HDL 47.90 06/30/2011   HDL 40.98 08/14/2010   HDL 11.91 07/25/2008   Lab Results  Component Value Date   LDLCALC 120* 06/30/2011   Lab Results  Component Value Date   TRIG 91.0 06/30/2011   TRIG 52.0 08/14/2010   TRIG 121.0 07/25/2008   Lab Results  Component Value Date   CHOLHDL 4 06/30/2011   CHOLHDL 4 08/14/2010   CHOLHDL 3 07/25/2008   Lab Results  Component Value Date   LDLDIRECT 163.5 08/14/2010   LDLDIRECT 133.1 07/25/2008    Diet - better, no red meat  Exercise- getting started back with elliptical machine   Wbc 10.9- ? If was related to cough No fever or other symptoms   Patient Active Problem List    Diagnoses  . MOOD SWINGS  . TMJ PAIN  . PREMENSTRUAL DYSPHORIC SYNDROME  . MENORRHAGIA  . ACNE VULGARIS  . MIGRAINES, HX OF  . TOBACCO USE, QUIT  . History of acne  . Keratosis pilaris  . Personal history of tobacco use  . Routine general medical examination at a health care facility  . Gynecological examination  . Hyperlipidemia  . Anxiety  . Cough   Past Medical History  Diagnosis Date  . History of acne     s/p tx with accutane  . Migraines   . PMDD (premenstrual dysphoric disorder)   . Keratosis pilaris   . Personal history of tobacco use    No past surgical history on file. History  Substance Use Topics  . Smoking status: Former Games developer  . Smokeless tobacco: Not on file  . Alcohol Use: Yes     Rarely   Family History  Problem Relation Age of Onset  . Colon polyps Mother   . Colitis Mother   . Cancer      aunt-lymphoma  No Known Allergies Current Outpatient Prescriptions on File Prior to Visit  Medication Sig Dispense Refill  . ALPRAZolam (XANAX) 0.5 MG tablet TAKE ONE TABLET BY MOUTH EVERY DAY AS NEEDED  30 tablet  3      Review of Systems Review of Systems  Constitutional: Negative for fever, appetite change, fatigue and unexpected weight change.  Eyes: Negative for pain and visual disturbance.  Respiratory: Negative for sob or wheeze    Cardiovascular: Negative for cp or palpitations    Gastrointestinal: Negative for nausea, diarrhea and constipation. neg for gerd symptoms or abd pain Genitourinary: Negative for urgency and frequency.  Skin: Negative for pallor or rash   Neurological: Negative for weakness, light-headedness, numbness and headaches.  Hematological: Negative for adenopathy. Does not bruise/bleed easily.  Psychiatric/Behavioral: Negative for dysphoric mood. The patient is not nervous/anxious.          Objective:   Physical Exam  Constitutional: She appears well-developed and well-nourished. No distress.  HENT:  Head:  Normocephalic and atraumatic.  Right Ear: External ear normal.  Left Ear: External ear normal.  Nose: Nose normal.  Mouth/Throat: Oropharynx is clear and moist.  Eyes: Conjunctivae and EOM are normal. Pupils are equal, round, and reactive to light. Right eye exhibits no discharge. Left eye exhibits no discharge. No scleral icterus.  Neck: Normal range of motion. Neck supple. No JVD present. Carotid bruit is not present. No thyromegaly present.  Cardiovascular: Normal rate, regular rhythm, normal heart sounds and intact distal pulses.  Exam reveals no gallop.   Pulmonary/Chest: Effort normal and breath sounds normal. No respiratory distress. She has no wheezes. She has no rales. She exhibits no tenderness.  Abdominal: Soft. Bowel sounds are normal. She exhibits no distension, no abdominal bruit and no mass. There is no tenderness.  Genitourinary: Rectum normal, vagina normal and uterus normal. No breast swelling, tenderness, discharge or bleeding. There is no rash or lesion on the right labia. There is no rash or lesion on the left labia. Uterus is not enlarged and not tender. Cervix exhibits no motion tenderness, no discharge and no friability. Right adnexum displays no mass, no tenderness and no fullness. Left adnexum displays no mass, no tenderness and no fullness. No erythema or bleeding around the vagina. No vaginal discharge found.       Breast exam: No mass, nodules, thickening, tenderness, bulging, retraction, inflamation, nipple discharge or skin changes noted.  No axillary or clavicular LA.  Chaperoned exam.    Musculoskeletal: Normal range of motion. She exhibits no edema and no tenderness.  Lymphadenopathy:    She has no cervical adenopathy.  Neurological: She is alert. She has normal reflexes. No cranial nerve deficit. Coordination normal.  Skin: Skin is warm and dry. No rash noted. No erythema. No pallor.  Psychiatric: She has a normal mood and affect.          Assessment & Plan:

## 2011-07-07 NOTE — Patient Instructions (Addendum)
Decrease prozac to 20 mg daily - let me know if any problems Chest x ray today - will update you  Use tussionex for cough with caution -- is really strong and sedating - will try to stop the cough cycle Cholesterol is improved - good job

## 2011-07-13 ENCOUNTER — Other Ambulatory Visit: Payer: Self-pay | Admitting: Family Medicine

## 2011-07-13 ENCOUNTER — Encounter: Payer: Self-pay | Admitting: *Deleted

## 2011-07-13 NOTE — Telephone Encounter (Signed)
Received refill request electronically. Is it okay to refill medication? 

## 2011-07-13 NOTE — Telephone Encounter (Signed)
Should not need that already? Just given a week ago

## 2011-07-14 ENCOUNTER — Other Ambulatory Visit: Payer: Self-pay | Admitting: *Deleted

## 2011-07-14 ENCOUNTER — Other Ambulatory Visit: Payer: Self-pay | Admitting: Family Medicine

## 2011-07-14 MED ORDER — HYDROCOD POLST-CHLORPHEN POLST 10-8 MG/5ML PO LQCR: 5.0000 mL | Freq: Two times a day (BID) | ORAL | Status: DC | PRN

## 2011-07-14 NOTE — Telephone Encounter (Signed)
Medicine called to pharmacy, advised pt.  Instructed as below.

## 2011-07-14 NOTE — Telephone Encounter (Signed)
1 teaspoon bid is the absolute maximum that is safe to take  2 teaspoons at once is an overdose !!!!- do not do that again Px written for call in   F/u if not improving

## 2011-07-14 NOTE — Telephone Encounter (Signed)
Pt states her refill request for tussionex was denied because it was too early. She was asking for an early refill because she has been taking 1-2 tsps twice a day because her cough is so bad.  She would still like the refill sent to [facility] [address].

## 2011-07-17 NOTE — Telephone Encounter (Signed)
Patient came in and said EOB stated it was filed to wrong insurance because she gave [place] her old card at time of visit.  Patient came in to give [place] her new card and Loistine Simas called pt accounting and asked that it be re-filed.

## 2011-08-20 ENCOUNTER — Telehealth: Payer: Self-pay | Admitting: Family Medicine

## 2011-08-20 NOTE — Telephone Encounter (Signed)
Patient faxed invoice and left voice mail questioning 08/26/10 date of service/bill regarding a statement from [employer] dated 08/13/11.  This account had been discussed previously and a charge adjustment was sent on the previous balance.  Email has been sent to billing/coding with current [employer] statement information and [employer] supervisor to adjust off per conversation with physician/patient and to stop billing letters/calls.

## 2011-08-24 NOTE — Telephone Encounter (Signed)
Sent to patient accounting [employer] supervisor and Geri with billing/coding to check on prev request to adjust charges and stop billing/collections.

## 2011-09-01 NOTE — Telephone Encounter (Signed)
Message also sent to [facility] for review and follow up with patient.  Thanks

## 2011-09-07 ENCOUNTER — Telehealth: Payer: Self-pay

## 2011-09-07 MED ORDER — FLUOXETINE HCL 20 MG PO CAPS: 20.0000 mg | ORAL_CAPSULE | Freq: Every day | ORAL | Status: DC

## 2011-09-07 NOTE — Telephone Encounter (Signed)
Done and sent electronically

## 2011-09-07 NOTE — Telephone Encounter (Signed)
Patient advised as instructed via telephone. 

## 2011-09-07 NOTE — Telephone Encounter (Signed)
Pt left v/m requesting changing Fluoxetine 20 mg from tabs back to capsules. Pt said tabs cost $20.00 and caps cost $4.00. Pt uses [facility] [address] and can be reached at [phone] or [phone].Pt seen 07/07/11.

## 2011-09-15 ENCOUNTER — Other Ambulatory Visit: Payer: Self-pay | Admitting: Family Medicine

## 2011-09-16 NOTE — Telephone Encounter (Signed)
Please ask pt how often she is taking this medicine before I refil it again, thanks

## 2011-09-16 NOTE — Telephone Encounter (Signed)
Before I fill this please check in with pt and find out how often she it taking this -thanks , it may have changed since I saw her

## 2011-09-16 NOTE — Telephone Encounter (Signed)
Left message on cell phone voicemail for patient to return call. 

## 2011-09-16 NOTE — Telephone Encounter (Signed)
Patient called back stating that she takes one tablet by mouth daily.

## 2011-09-17 NOTE — Telephone Encounter (Signed)
Px written for call in   

## 2011-09-17 NOTE — Telephone Encounter (Signed)
Rx called to [facility]

## 2011-10-09 NOTE — Telephone Encounter (Signed)
We had sent a message for charge to be adjusted off and patient called in and said that it was; however, she was billed again and when she called in that cust service verified it was adjusted off and appeared to be a duplicate charge.  She said that cust service requested that the manager send a fax stating to adjust the duplicate charge and that they would take care of it.  Faxed to the billing dept number provided and requested second adjustment.

## 2011-10-23 ENCOUNTER — Other Ambulatory Visit: Payer: Self-pay | Admitting: Family Medicine

## 2011-12-18 ENCOUNTER — Other Ambulatory Visit: Payer: PRIVATE HEALTH INSURANCE

## 2011-12-25 ENCOUNTER — Encounter: Payer: PRIVATE HEALTH INSURANCE | Admitting: Family Medicine

## 2012-01-22 ENCOUNTER — Other Ambulatory Visit: Payer: Self-pay | Admitting: Family Medicine

## 2012-01-22 NOTE — Telephone Encounter (Signed)
Px written for call in   

## 2012-01-22 NOTE — Telephone Encounter (Signed)
Ok to refill, last OV was 07/07/11 and no future appt.

## 2012-01-22 NOTE — Telephone Encounter (Signed)
Called in xanax as prescribed

## 2012-03-24 ENCOUNTER — Other Ambulatory Visit: Payer: Self-pay | Admitting: Family Medicine

## 2012-03-24 NOTE — Telephone Encounter (Signed)
Rx called in as prescribed 

## 2012-03-24 NOTE — Telephone Encounter (Signed)
Px written for call in   

## 2012-03-24 NOTE — Telephone Encounter (Signed)
Ok to refill 

## 2012-04-21 ENCOUNTER — Other Ambulatory Visit: Payer: Self-pay | Admitting: Family Medicine

## 2012-04-22 ENCOUNTER — Other Ambulatory Visit: Payer: Self-pay | Admitting: Family Medicine

## 2012-04-22 NOTE — Telephone Encounter (Signed)
Px written for call in   

## 2012-04-22 NOTE — Telephone Encounter (Signed)
Ok to refill 

## 2012-04-22 NOTE — Telephone Encounter (Signed)
Rx called in as prescribed 

## 2012-08-22 ENCOUNTER — Other Ambulatory Visit: Payer: Self-pay | Admitting: Family Medicine

## 2012-08-22 NOTE — Telephone Encounter (Signed)
Ok to refill 

## 2012-08-22 NOTE — Telephone Encounter (Signed)
Px written for call in   

## 2012-08-22 NOTE — Telephone Encounter (Signed)
Rx called in as prescribed 

## 2012-09-15 ENCOUNTER — Other Ambulatory Visit: Payer: Self-pay | Admitting: Family Medicine

## 2012-09-15 NOTE — Telephone Encounter (Signed)
Please give 6 mo of refils 

## 2012-09-15 NOTE — Telephone Encounter (Signed)
Ok to refill 

## 2012-09-15 NOTE — Telephone Encounter (Signed)
done

## 2012-12-21 ENCOUNTER — Other Ambulatory Visit: Payer: Self-pay | Admitting: Family Medicine

## 2012-12-21 NOTE — Telephone Encounter (Signed)
Electronic refill request, please advise  

## 2012-12-21 NOTE — Telephone Encounter (Signed)
Px written for call in   Please have her schedule a PE

## 2012-12-22 NOTE — Telephone Encounter (Signed)
Pt's phone was off so I mailed letter letting pt know she needs to schedule a CPE, and Rx called in as prescribed

## 2012-12-23 ENCOUNTER — Encounter: Payer: Self-pay | Admitting: Radiology

## 2012-12-26 ENCOUNTER — Ambulatory Visit (INDEPENDENT_AMBULATORY_CARE_PROVIDER_SITE_OTHER): Payer: BC Managed Care – PPO | Admitting: Family Medicine

## 2012-12-26 ENCOUNTER — Encounter: Payer: Self-pay | Admitting: Family Medicine

## 2012-12-26 ENCOUNTER — Ambulatory Visit (INDEPENDENT_AMBULATORY_CARE_PROVIDER_SITE_OTHER)
Admission: RE | Admit: 2012-12-26 | Discharge: 2012-12-26 | Disposition: A | Discharge status: Home / Self Care | Payer: BC Managed Care – PPO | Source: Ambulatory Visit | Attending: Family Medicine | Admitting: Family Medicine

## 2012-12-26 VITALS — BP 106/64 | HR 79 | Temp 98.6°F | Ht 64.5 in | Wt 169.2 lb

## 2012-12-26 DIAGNOSIS — Z0000 Encounter for general adult medical examination without abnormal findings: Secondary | ICD-10-CM

## 2012-12-26 DIAGNOSIS — M545 Low back pain, unspecified: Secondary | ICD-10-CM

## 2012-12-26 DIAGNOSIS — L989 Disorder of the skin and subcutaneous tissue, unspecified: Secondary | ICD-10-CM

## 2012-12-26 DIAGNOSIS — E785 Hyperlipidemia, unspecified: Secondary | ICD-10-CM

## 2012-12-26 LAB — CBC WITH DIFFERENTIAL/PLATELET
Basophils Relative: 0.6 % (ref 0.0–3.0)
Eosinophils Relative: 2.8 % (ref 0.0–5.0)
HCT: 40 % (ref 36.0–46.0)
MCV: 90.5 fl (ref 78.0–100.0)
Monocytes Relative: 6.7 % (ref 3.0–12.0)
Neutrophils Relative %: 53 % (ref 43.0–77.0)
Platelets: 226 10*3/uL (ref 150.0–400.0)
RBC: 4.42 Mil/uL (ref 3.87–5.11)
WBC: 7.9 10*3/uL (ref 4.5–10.5)

## 2012-12-26 LAB — COMPREHENSIVE METABOLIC PANEL
Albumin: 3.7 g/dL (ref 3.5–5.2)
BUN: 12 mg/dL (ref 6–23)
CO2: 27 mEq/L (ref 19–32)
Calcium: 8.7 mg/dL (ref 8.4–10.5)
GFR: 122.32 mL/min (ref >60.00)
Glucose, Bld: 97 mg/dL (ref 70–99)
Potassium: 4 mEq/L (ref 3.5–5.1)
Sodium: 135 mEq/L (ref 135–145)
Total Protein: 6.7 g/dL (ref 6.0–8.3)

## 2012-12-26 LAB — LIPID PANEL: VLDL: 62 mg/dL — ABNORMAL HIGH (ref 0.0–40.0)

## 2012-12-26 LAB — TSH: TSH: 0.9 u[IU]/mL (ref 0.35–5.50)

## 2012-12-26 MED ORDER — CYCLOBENZAPRINE HCL 10 MG PO TABS: 10.0000 mg | ORAL_TABLET | Freq: Three times a day (TID) | ORAL | Status: DC | PRN

## 2012-12-26 MED ORDER — MELOXICAM 15 MG PO TABS: 15.0000 mg | ORAL_TABLET | Freq: Every day | ORAL | Status: DC

## 2012-12-26 NOTE — Progress Notes (Signed)
Subjective:    Patient ID: Laura Hamilton, female    DOB: [birth year], 37 y.o.   MRN: [id number]  HPI Was here for a PE - but then her back started hurting and will address that instead   Back pain -- started when she came home for lunch a 1 1/2 weeks ago  Pain is worse to walk and sit up straight  Lower back - both sides  Pain scale 8-10  Pain is not radiating down either leg  No numbness or weakness  No sudden loss of bowel or bladder control  This has never happened before  A little relief when she lets herself slump No particular trauma -not a lot of exercise- some walking , no heavy lifting  Work ergonomics have changed- sitting and standing - different chairs in diff areas   Mattress is the same -not old or new   She has tried ibuprofen and tylenol   Tried the following  Massage  Soak in epsom salt  Heat Mild improvement   Also skin lesion on L arm is larger  Firm and mobile and nt  Has been there for a while - but recently changed    Patient Active Problem List   Diagnosis Date Noted  . Cough 07/07/2011  . Gynecological examination 08/26/2010  . Hyperlipidemia 08/26/2010  . Anxiety 08/26/2010  . Routine general medical examination at a health care facility 08/13/2010  . History of acne   . Keratosis pilaris   . Personal history of tobacco use   . TMJ PAIN 03/13/2009  . PREMENSTRUAL DYSPHORIC SYNDROME 07/25/2008  . MOOD SWINGS 07/14/2007  . MENORRHAGIA 07/14/2007  . ACNE VULGARIS 10/08/2006  . MIGRAINES, HX OF 10/08/2006  . TOBACCO USE, QUIT 10/08/2006   Past Medical History  Diagnosis Date  . History of acne     s/p tx with accutane  . Migraines   . PMDD (premenstrual dysphoric disorder)   . Keratosis pilaris   . Personal history of tobacco use    No past surgical history on file. History  Substance Use Topics  . Smoking status: Former Games developer  . Smokeless tobacco: Not on file  . Alcohol Use: Yes     Comment: Rarely   Family History  Problem  Relation Age of Onset  . Colon polyps Mother   . Colitis Mother   . Cancer      aunt-lymphoma   Allergies  Allergen Reactions  . Other     ALL NARCOTICS   Current Outpatient Prescriptions on File Prior to Visit  Medication Sig Dispense Refill  . ALPRAZolam (XANAX) 0.5 MG tablet TAKE ONE TABLET BY MOUTH ONCE DAILY AS NEEDED  30 tablet  0  . FLUoxetine (PROZAC) 20 MG capsule TAKE ONE CAPSULE BY MOUTH EVERY DAY  30 capsule  5   No current facility-administered medications on file prior to visit.    Review of Systems Review of Systems  Constitutional: Negative for fever, appetite change, fatigue and unexpected weight change.  Eyes: Negative for pain and visual disturbance.  Respiratory: Negative for cough and shortness of breath.   Cardiovascular: Negative for cp or palpitations    Gastrointestinal: Negative for nausea, diarrhea and constipation.  Genitourinary: Negative for urgency and frequency. neg for blood in urine or flank pain  MSK pos for back pain/ neg for joint changes or swelling  Skin: Negative for pallor or rash   Neurological: Negative for weakness, light-headedness, numbness and headaches.  Hematological: Negative for adenopathy. Does not  bruise/bleed easily.  Psychiatric/Behavioral: Negative for dysphoric mood. The patient is not nervous/anxious.         Objective:   Physical Exam  Constitutional: She appears well-developed and well-nourished. No distress.  HENT:  Head: Normocephalic and atraumatic.  Eyes: Conjunctivae and EOM are normal. Pupils are equal, round, and reactive to light.  Neck: Normal range of motion. Neck supple.  Pulmonary/Chest: Effort normal and breath sounds normal.  Musculoskeletal: She exhibits tenderness. She exhibits no edema.       Lumbar back: She exhibits decreased range of motion, tenderness and spasm. She exhibits no bony tenderness, no swelling, no edema and no deformity.  Tenderness in L lumbar musculature  Flex LS 30 deg and ext  10 deg Pain to flex laterally to R  Neg SLR  Slow gait  Nl rom hips  Neurological: She is alert. She has normal reflexes. She displays no atrophy. No cranial nerve deficit or sensory deficit. She exhibits normal muscle tone. Coordination normal.  Skin: Skin is warm and dry. No rash noted. No erythema. No pallor.  Firm 3-4 mm lesion on L upper arm - tan in color and nt          Assessment & Plan:

## 2012-12-26 NOTE — Patient Instructions (Addendum)
Lab today Schedule physical -any 30 minute slot (this is a re schedule) Xray today  meloxicam once daily with food as needed for back pain  Flexeril - with caution for spasm  Heat may be helpful also  Slow walking is recommended

## 2012-12-26 NOTE — Assessment & Plan Note (Signed)
Suspect lumbar sprain/ strain and spasm  Xray today given that this is first incidence If nl will ref to PT Recommend heat and given mobic for nsaid Flexeril for pm use or when home-warned of sedation  No neurol s/s

## 2012-12-26 NOTE — Assessment & Plan Note (Signed)
This resembles a dermatofibroma - but is enlarging  Ref to derm for eval and tx

## 2012-12-30 ENCOUNTER — Encounter: Payer: BC Managed Care – PPO | Admitting: Family Medicine

## 2013-01-13 ENCOUNTER — Encounter: Payer: Self-pay | Admitting: Family Medicine

## 2013-01-20 ENCOUNTER — Other Ambulatory Visit: Payer: Self-pay | Admitting: Family Medicine

## 2013-01-20 NOTE — Telephone Encounter (Signed)
Electronic refill request, please advise  

## 2013-01-20 NOTE — Telephone Encounter (Signed)
Px written for call in   

## 2013-01-20 NOTE — Telephone Encounter (Signed)
Rx called in as prescribed 

## 2013-01-27 ENCOUNTER — Other Ambulatory Visit (HOSPITAL_COMMUNITY)
Admission: RE | Admit: 2013-01-27 | Discharge: 2013-01-27 | Disposition: A | Discharge status: Home / Self Care | Payer: BC Managed Care – PPO | Source: Ambulatory Visit | Attending: Family Medicine | Admitting: Family Medicine

## 2013-01-27 ENCOUNTER — Encounter: Payer: Self-pay | Admitting: Family Medicine

## 2013-01-27 ENCOUNTER — Ambulatory Visit (INDEPENDENT_AMBULATORY_CARE_PROVIDER_SITE_OTHER): Payer: BC Managed Care – PPO | Admitting: Family Medicine

## 2013-01-27 VITALS — BP 116/84 | HR 93 | Temp 98.4°F | Ht 64.0 in | Wt 168.8 lb

## 2013-01-27 DIAGNOSIS — Z01419 Encounter for gynecological examination (general) (routine) without abnormal findings: Secondary | ICD-10-CM

## 2013-01-27 DIAGNOSIS — F419 Anxiety disorder, unspecified: Secondary | ICD-10-CM

## 2013-01-27 DIAGNOSIS — Z23 Encounter for immunization: Secondary | ICD-10-CM

## 2013-01-27 DIAGNOSIS — Z0000 Encounter for general adult medical examination without abnormal findings: Secondary | ICD-10-CM

## 2013-01-27 DIAGNOSIS — F411 Generalized anxiety disorder: Secondary | ICD-10-CM

## 2013-01-27 DIAGNOSIS — E785 Hyperlipidemia, unspecified: Secondary | ICD-10-CM

## 2013-01-27 MED ORDER — FLUOXETINE HCL 20 MG PO CAPS: 40.0000 mg | ORAL_CAPSULE | Freq: Every day | ORAL | Status: DC

## 2013-01-27 NOTE — Progress Notes (Signed)
Subjective:    Patient ID: Laura Hamilton, female    DOB: [birth year], 37 y.o.   MRN: [id number]  HPI Here for health maintenance exam and to review chronic medical problems    Back is feeling better  Otherwise feeling good also   Wt is down 1 lb  She fluctuates between 160-168  Needs to exercise more - her husband is a trainer  (he tries to train her ) Going hiking more on the weekends  She has cut back on her snacking at night - proud of this , also drinking more water    Pap 3/13--needs to do that  Menses-no problems  Contraception-disliked her old OC  Is interested in non oral contraception - may talk to GYN in the future  Using condoms right now   Flu shot- wants to get one today   Td 5/07  More anx lately Feels need with her job to take xanax occ bid  Thinks she would benefit from inc in prozac (has tol 40mg  before) Not seeing a counselor Getting used to a new job env  Lab Results  Component Value Date   CHOL 161 12/26/2012   CHOL 186 06/30/2011   CHOL 240* 08/14/2010   Lab Results  Component Value Date   HDL 34.80* 12/26/2012   HDL 47.90 06/30/2011   HDL 40.98 08/14/2010   Lab Results  Component Value Date   LDLCALC 120* 06/30/2011   Lab Results  Component Value Date   TRIG 310.0* 12/26/2012   TRIG 91.0 06/30/2011   TRIG 52.0 08/14/2010   Lab Results  Component Value Date   CHOLHDL 5 12/26/2012   CHOLHDL 4 06/30/2011   CHOLHDL 4 08/14/2010   Lab Results  Component Value Date   LDLDIRECT 91.9 12/26/2012   LDLDIRECT 163.5 08/14/2010   LDLDIRECT 133.1 07/25/2008      Review of Systems Review of Systems  Constitutional: Negative for fever, appetite change, fatigue and unexpected weight change.  Eyes: Negative for pain and visual disturbance.  Respiratory: Negative for cough and shortness of breath.   Cardiovascular: Negative for cp or palpitations    Gastrointestinal: Negative for nausea, diarrhea and constipation.  Genitourinary: Negative for urgency and  frequency.  Skin: Negative for pallor or rash   Neurological: Negative for weakness, light-headedness, numbness and headaches.  Hematological: Negative for adenopathy. Does not bruise/bleed easily.  Psychiatric/Behavioral: Negative for dysphoric mood. The patient is more anxious than usual/ also more stressors          Objective:   Physical Exam  Constitutional: She appears well-developed and well-nourished. No distress.  overwt and well app  HENT:  Head: Normocephalic and atraumatic.  Right Ear: External ear normal.  Left Ear: External ear normal.  Nose: Nose normal.  Mouth/Throat: Oropharynx is clear and moist.  Eyes: Conjunctivae and EOM are normal. Pupils are equal, round, and reactive to light. Right eye exhibits no discharge. Left eye exhibits no discharge. No scleral icterus.  Neck: Normal range of motion. Neck supple. No JVD present. Carotid bruit is not present. No thyromegaly present.  Cardiovascular: Normal rate, regular rhythm, normal heart sounds and intact distal pulses.  Exam reveals no gallop.   Pulmonary/Chest: Effort normal and breath sounds normal. No respiratory distress. She has no wheezes. She has no rales.  Abdominal: Soft. Bowel sounds are normal. She exhibits no distension, no abdominal bruit and no mass. There is no tenderness.  Genitourinary: Vagina normal and uterus normal. No breast swelling, tenderness, discharge or bleeding.  There is no rash, tenderness or lesion on the right labia. There is no rash, tenderness or lesion on the left labia. Uterus is not enlarged and not tender. Cervix exhibits no motion tenderness, no discharge and no friability. Right adnexum displays no mass, no tenderness and no fullness. Left adnexum displays no mass, no tenderness and no fullness. No bleeding around the vagina. No vaginal discharge found.  Breast exam: No mass, nodules, thickening, tenderness, bulging, retraction, inflamation, nipple discharge or skin changes noted.  No  axillary or clavicular LA.  Chaperoned exam.    Musculoskeletal: She exhibits no edema and no tenderness.  Lymphadenopathy:    She has no cervical adenopathy.  Neurological: She is alert. She has normal reflexes. No cranial nerve deficit. She exhibits normal muscle tone. Coordination normal.  Skin: Skin is warm and dry. No rash noted. No erythema. No pallor.  Psychiatric: She has a normal mood and affect.          Assessment & Plan:

## 2013-01-27 NOTE — Patient Instructions (Addendum)
Pap today Flu vaccine today Take care of yourself Check out the App - "myfitnesspal"

## 2013-01-29 NOTE — Assessment & Plan Note (Signed)
Disc goals for lipids and reasons to control them Rev labs with pt Rev low sat fat diet in detail   

## 2013-01-29 NOTE — Assessment & Plan Note (Signed)
Reviewed health habits including diet and exercise and skin cancer prevention Also reviewed health mt list, fam hx and immunizations   Wellness labs reviewed  

## 2013-01-29 NOTE — Assessment & Plan Note (Signed)
Pt having worse symptoms with job stress and finds herself needing more xanax  Will inc prozac to 40 mg  Disc keeping xanax for emergencies-and habit forming potential of that  Disc exercise/ coping tech and counseling opt

## 2013-01-29 NOTE — Assessment & Plan Note (Signed)
Exam and pap done Pt may see gyn in the future to disc non OC contraception opt - disc some of these

## 2013-02-02 ENCOUNTER — Encounter: Payer: Self-pay | Admitting: *Deleted

## 2013-05-23 ENCOUNTER — Other Ambulatory Visit: Payer: Self-pay | Admitting: Family Medicine

## 2013-05-23 NOTE — Telephone Encounter (Signed)
Electronic refill request, please advise  

## 2013-05-23 NOTE — Telephone Encounter (Signed)
Px written for call in   

## 2013-05-23 NOTE — Telephone Encounter (Signed)
Rx called in as prescribed 

## 2013-08-07 ENCOUNTER — Encounter: Payer: Self-pay | Admitting: Radiology

## 2013-08-08 ENCOUNTER — Encounter: Payer: Self-pay | Admitting: Internal Medicine

## 2013-08-08 ENCOUNTER — Ambulatory Visit (INDEPENDENT_AMBULATORY_CARE_PROVIDER_SITE_OTHER): Payer: Managed Care, Other (non HMO) | Admitting: Internal Medicine

## 2013-08-08 VITALS — BP 108/72 | HR 80 | Temp 98.8°F | Wt 153.2 lb

## 2013-08-08 DIAGNOSIS — N739 Female pelvic inflammatory disease, unspecified: Secondary | ICD-10-CM

## 2013-08-08 MED ORDER — SULFAMETHOXAZOLE-TMP DS 800-160 MG PO TABS: 1.0000 | ORAL_TABLET | Freq: Two times a day (BID) | ORAL | Status: DC

## 2013-08-08 MED ORDER — TRAMADOL HCL 50 MG PO TABS: 50.0000 mg | ORAL_TABLET | Freq: Three times a day (TID) | ORAL | Status: DC | PRN

## 2013-08-08 NOTE — Patient Instructions (Addendum)
Abscess An abscess is an infected area that contains a collection of pus and debris.It can occur in almost any part of the body. An abscess is also known as a furuncle or boil. CAUSES  An abscess occurs when tissue gets infected. This can occur from blockage of oil or sweat glands, infection of hair follicles, or a minor injury to the skin. As the body tries to fight the infection, pus collects in the area and creates pressure under the skin. This pressure causes pain. People with weakened immune systems have difficulty fighting infections and get certain abscesses more often.  SYMPTOMS Usually an abscess develops on the skin and becomes a painful mass that is red, warm, and tender. If the abscess forms under the skin, you may feel a moveable soft area under the skin. Some abscesses break open (rupture) on their own, but most will continue to get worse without care. The infection can spread deeper into the body and eventually into the bloodstream, causing you to feel ill.  DIAGNOSIS  Your caregiver will take your medical history and perform a physical exam. A sample of fluid may also be taken from the abscess to determine what is causing your infection. TREATMENT  Your caregiver may prescribe antibiotic medicines to fight the infection. However, taking antibiotics alone usually does not cure an abscess. Your caregiver may need to make a small cut (incision) in the abscess to drain the pus. In some cases, gauze is packed into the abscess to reduce pain and to continue draining the area. HOME CARE INSTRUCTIONS   Only take over-the-counter or prescription medicines for pain, discomfort, or fever as directed by your caregiver.  If you were prescribed antibiotics, take them as directed. Finish them even if you start to feel better.  If gauze is used, follow your caregiver's directions for changing the gauze.  To avoid spreading the infection:  Keep your draining abscess covered with a  bandage.  Wash your hands well.  Do not share personal care items, towels, or whirlpools with others.  Avoid skin contact with others.  Keep your skin and clothes clean around the abscess.  Keep all follow-up appointments as directed by your caregiver. SEEK MEDICAL CARE IF:   You have increased pain, swelling, redness, fluid drainage, or bleeding.  You have muscle aches, chills, or a general ill feeling.  You have a fever. MAKE SURE YOU:   Understand these instructions.  Will watch your condition.  Will get help right away if you are not doing well or get worse. Document Released: 01/28/2005 Document Revised: 10/20/2011 Document Reviewed: 07/03/2011 [employer] Patient Information 2014 [employer].  

## 2013-08-08 NOTE — Progress Notes (Signed)
Pre visit review using our clinic review tool, if applicable. No additional management support is needed unless otherwise documented below in the visit note. 

## 2013-08-08 NOTE — Progress Notes (Signed)
Subjective:    Patient ID: Laura Hamilton, female    DOB: 1976-05-04, 38 y.o.   MRN: [id number]  HPI  Pt presents to the clinic today with c/o an abscess. She noticed this 2-3 weeks ago. She has noticed it has become more painful, warm, red and tender. She has put hot compresses on it and has noticed that it has drained. She thinks she might need it incised and drained. She has had an abscess before in her armpit that she had to have drained. She denies fever, chills or body aches.  Review of Systems      Past Medical History  Diagnosis Date  . History of acne     s/p tx with accutane  . Migraines   . PMDD (premenstrual dysphoric disorder)   . Keratosis pilaris   . Personal history of tobacco use     Current Outpatient Prescriptions  Medication Sig Dispense Refill  . ALPRAZolam (XANAX) 0.5 MG tablet TAKE 1 TABLET BY MOUTH DAILY AS NEEDED  30 tablet  5  . cyclobenzaprine (FLEXERIL) 10 MG tablet Take 1 tablet (10 mg total) by mouth 3 (three) times daily as needed for muscle spasms. When not driving or working  30 tablet  0  . FLUoxetine (PROZAC) 20 MG capsule Take 2 capsules (40 mg total) by mouth daily.  60 capsule  11  . meloxicam (MOBIC) 15 MG tablet Take 1 tablet (15 mg total) by mouth daily. With food  30 tablet  0   No current facility-administered medications for this visit.    Allergies  Allergen Reactions  . Other     ALL NARCOTICS    Family History  Problem Relation Age of Onset  . Colon polyps Mother   . Colitis Mother   . Cancer      aunt-lymphoma    History   Social History  . Marital Status: Divorced    Spouse Name: N/A    Number of Children: 1  . Years of Education: N/A   Occupational History  . Insurance    Social History Main Topics  . Smoking status: Former Games developer  . Smokeless tobacco: Not on file  . Alcohol Use: Yes     Comment: Rarely  . Drug Use: No  . Sexual Activity: Not on file   Other Topics Concern  . Not on file   Social  History Narrative  . No narrative on file     Constitutional: Denies fever, malaise, fatigue, headache or abrupt weight changes.  Skin: Pt reports lump on vagina. Denies rashes, lesions or ulcercations.  Neurological: Denies dizziness, difficulty with memory, difficulty with speech or problems with balance and coordination.   No other specific complaints in a complete review of systems (except as listed in HPI above).  Objective:   Physical Exam    BP 108/72  Pulse 80  Temp(Src) 98.8 F (37.1 C) (Oral)  Wt 153 lb 4 oz (69.514 kg)  SpO2 98% Wt Readings from Last 3 Encounters:  08/08/13 153 lb 4 oz (69.514 kg)  01/27/13 168 lb 12.8 oz (76.567 kg)  12/26/12 169 lb 4 oz (76.771 kg)    General: Appears her stated age, well developed, well nourished in NAD. Skin: Two small < 1 cm abscesses noted on anterior vulva. Small amount of cellulitis noted. Not able to drain. Cardiovascular: Normal rate and rhythm. S1,S2 noted.  No murmur, rubs or gallops noted. No JVD or BLE edema. No carotid bruits noted. Pulmonary/Chest: Normal effort  and positive vesicular breath sounds. No respiratory distress. No wheezes, rales or ronchi noted.     BMET    Component Value Date/Time   NA 135 12/26/2012 1056   K 4.0 12/26/2012 1056   CL 105 12/26/2012 1056   CO2 27 12/26/2012 1056   GLUCOSE 97 12/26/2012 1056   BUN 12 12/26/2012 1056   CREATININE 0.6 12/26/2012 1056   CALCIUM 8.7 12/26/2012 1056   GFRNONAA 103.09 07/25/2008 1507    Lipid Panel     Component Value Date/Time   CHOL 161 12/26/2012 1056   TRIG 310.0* 12/26/2012 1056   HDL 34.80* 12/26/2012 1056   CHOLHDL 5 12/26/2012 1056   VLDL 62.0* 12/26/2012 1056   LDLCALC 120* 06/30/2011 1002    CBC    Component Value Date/Time   WBC 7.9 12/26/2012 1056   RBC 4.42 12/26/2012 1056   HGB 13.5 12/26/2012 1056   HCT 40.0 12/26/2012 1056   PLT 226.0 12/26/2012 1056   MCV 90.5 12/26/2012 1056   MCHC 33.7 12/26/2012 1056   RDW 12.9 12/26/2012 1056    LYMPHSABS 2.9 12/26/2012 1056   MONOABS 0.5 12/26/2012 1056   EOSABS 0.2 12/26/2012 1056   BASOSABS 0.1 12/26/2012 1056    Hgb A1C No results found for this basename: HGBA1C       Assessment & Plan:   Cellulitis of abscess of vulva:  Not able to drain eRx for septra BID x 5 days eRx for tramadol for pain  RTC as needed or if symptoms persist or worsen

## 2013-11-20 ENCOUNTER — Other Ambulatory Visit: Payer: Self-pay | Admitting: Family Medicine

## 2013-11-20 NOTE — Telephone Encounter (Signed)
Px written for call in   

## 2013-11-20 NOTE — Telephone Encounter (Signed)
Electronic refill request, please advise  

## 2013-11-21 ENCOUNTER — Emergency Department: Payer: Self-pay | Admitting: Emergency Medicine

## 2013-11-21 NOTE — Telephone Encounter (Signed)
Rx called in as prescribed 

## 2014-02-06 ENCOUNTER — Other Ambulatory Visit: Payer: Self-pay | Admitting: Family Medicine

## 2014-02-07 NOTE — Telephone Encounter (Signed)
Electronic refill request, please advise  

## 2014-02-07 NOTE — Telephone Encounter (Signed)
Please schedule a PE and refill until then  

## 2014-02-08 NOTE — Telephone Encounter (Signed)
Pt scheduled cpe 03/05/14. Please send in rx.

## 2014-02-08 NOTE — Telephone Encounter (Signed)
Left voicemail requesting pt to call office and schedule her CPE and then we can refill Rx

## 2014-03-05 ENCOUNTER — Encounter: Payer: Managed Care, Other (non HMO) | Admitting: Family Medicine

## 2014-03-20 ENCOUNTER — Other Ambulatory Visit: Payer: Self-pay | Admitting: Family Medicine

## 2014-03-20 NOTE — Telephone Encounter (Signed)
Electronic refill request, please advise  

## 2014-03-20 NOTE — Telephone Encounter (Signed)
Px written for call in   

## 2014-03-20 NOTE — Telephone Encounter (Signed)
Rx called in as prescribed 

## 2014-03-26 ENCOUNTER — Ambulatory Visit: Payer: Managed Care, Other (non HMO) | Admitting: Family Medicine

## 2014-03-27 ENCOUNTER — Ambulatory Visit (INDEPENDENT_AMBULATORY_CARE_PROVIDER_SITE_OTHER): Payer: BC Managed Care – PPO | Admitting: Family Medicine

## 2014-03-27 ENCOUNTER — Encounter: Payer: Self-pay | Admitting: Family Medicine

## 2014-03-27 VITALS — BP 98/64 | HR 69 | Temp 98.2°F | Ht 64.0 in | Wt 171.5 lb

## 2014-03-27 DIAGNOSIS — N926 Irregular menstruation, unspecified: Secondary | ICD-10-CM

## 2014-03-27 DIAGNOSIS — Z3490 Encounter for supervision of normal pregnancy, unspecified, unspecified trimester: Secondary | ICD-10-CM | POA: Insufficient documentation

## 2014-03-27 DIAGNOSIS — Z3201 Encounter for pregnancy test, result positive: Secondary | ICD-10-CM

## 2014-03-27 LAB — POCT URINE PREGNANCY: Preg Test, Ur: POSITIVE

## 2014-03-27 NOTE — Progress Notes (Signed)
Pre visit review using our clinic review tool, if applicable. No additional management support is needed unless otherwise documented below in the visit note. 

## 2014-03-27 NOTE — Patient Instructions (Signed)
Get some prenatal vitamins over the counter and take one daily  The book most recommended is called "What to expect when you are expecting"  Avoid caffeine - decaf is fine  Drink lots of water Eat small frequent meals  Stop your current medication   Stop at check out for referral to obgyn

## 2014-03-27 NOTE — Progress Notes (Signed)
Subjective:    Patient ID: Laura Hamilton, female    DOB: 01/02/1976, 38 y.o.   MRN: [id number]  HPI Here for pos preg test   LMP was oct 4th  Normal period  Was not on contraception   She has had one child (her daugther is currently 918)  Had bad nausea with first pregnancy   Wt is up 18 lb   Husband thinks she needs to drink enough water  Not nauseated at all - no vomiting  She is hungry at weird times/ no cravings  Is extremely tired  She is getting enough sleep  She has had some mild cramping occ  No spotting  bp is great 98/64   No alcohol  She still takes generic prozac and xanax - as needed   Not taking a prenatal vitamin     Does not exercise regularly  Wants to start walking   Does not have an OBGYN  She would [facility] deliver [facility]   Patient Active Problem List   Diagnosis Date Noted  . Low back pain 12/26/2012  . Skin lesion of left arm 12/26/2012  . Cough 07/07/2011  . Encounter for routine gynecological examination 08/26/2010  . Hyperlipidemia 08/26/2010  . Anxiety 08/26/2010  . Routine general medical examination at a health care facility 08/13/2010  . History of acne   . Keratosis pilaris   . Personal history of tobacco use   . TMJ PAIN 03/13/2009  . PREMENSTRUAL DYSPHORIC SYNDROME 07/25/2008  . MOOD SWINGS 07/14/2007  . MENORRHAGIA 07/14/2007  . ACNE VULGARIS 10/08/2006  . MIGRAINES, HX OF 10/08/2006  . TOBACCO USE, QUIT 10/08/2006   Past Medical History  Diagnosis Date  . History of acne     s/p tx with accutane  . Migraines   . PMDD (premenstrual dysphoric disorder)   . Keratosis pilaris   . Personal history of tobacco use    No past surgical history on file. History  Substance Use Topics  . Smoking status: Former Games developermoker  . Smokeless tobacco: Not on file  . Alcohol Use: No     Comment: Rarely   Family History  Problem Relation Age of Onset  . Colon polyps Mother   . Colitis Mother   . Cancer     aunt-lymphoma   Allergies  Allergen Reactions  . Other     ALL NARCOTICS   Current Outpatient Prescriptions on File Prior to Visit  Medication Sig Dispense Refill  . ALPRAZolam (XANAX) 0.5 MG tablet TAKE 1 TABLET BY MOUTH DAILY AS NEEDED 30 tablet 3  . FLUoxetine (PROZAC) 20 MG capsule TAKE 2 CAPSULES BY MOUTH ONCE DAILY 60 capsule 1   No current facility-administered medications on file prior to visit.     Review of Systems Review of Systems  Constitutional: Negative for fever, appetite change, and unexpected weight change.  Eyes: Negative for pain and visual disturbance.  Respiratory: Negative for cough and shortness of breath.   Cardiovascular: Negative for cp or palpitations    Gastrointestinal: Negative for nausea, diarrhea and constipation.  Genitourinary: Negative for urgency and pos for mild inc in frequency Skin: Negative for pallor or rash   Neurological: Negative for weakness, light-headedness, numbness and headaches.  Hematological: Negative for adenopathy. Does not bruise/bleed easily.  Psychiatric/Behavioral: Negative for dysphoric mood. The patient is not nervous/anxious. (she is apprehensive about anxiety when she stops her medicine)         Objective:   Physical Exam  Constitutional: She appears well-developed and well-nourished. No distress.  HENT:  Head: Normocephalic and atraumatic.  Mouth/Throat: Oropharynx is clear and moist.  Eyes: Conjunctivae and EOM are normal. Pupils are equal, round, and reactive to light. No scleral icterus.  Neck: Normal range of motion. Neck supple. No JVD present.  Cardiovascular: Normal rate, regular rhythm and normal heart sounds.   Pulmonary/Chest: Effort normal and breath sounds normal. No respiratory distress. She has no wheezes. She has no rales.  Abdominal: Soft. Bowel sounds are normal. She exhibits no distension. There is no tenderness.  No suprapubic tenderness or fullness    Musculoskeletal: She exhibits no edema.    Lymphadenopathy:    She has no cervical adenopathy.  Neurological: She is alert. She has normal reflexes. No cranial nerve deficit. She exhibits normal muscle tone. Coordination normal.  Skin: Skin is warm and dry. No rash noted. No erythema. No pallor.  Psychiatric: She has a normal mood and affect.          Assessment & Plan:   Problem List Items Addressed This Visit      Other   Pregnancy    Early - around 7 weeks  Disc lifestyle/ diet/ fluid intake  Adv to stop prozac and xanax now  Rev avoidance of otc meds and also alcohol and caffeine   Disc resources to review  Will begin PNV asap   Ref to obgyn    Relevant Orders      Ambulatory referral to Obstetrics / Gynecology    Other Visit Diagnoses    Missed period    -  Primary    Relevant Orders       POCT urine pregnancy (Completed)

## 2014-03-30 NOTE — Assessment & Plan Note (Signed)
Early - around 7 weeks  Disc lifestyle/ diet/ fluid intake  Adv to stop prozac and xanax now  Rev avoidance of otc meds and also alcohol and caffeine   Disc resources to review  Will begin PNV asap   Ref to obgyn

## 2014-04-03 ENCOUNTER — Ambulatory Visit (INDEPENDENT_AMBULATORY_CARE_PROVIDER_SITE_OTHER): Payer: BC Managed Care – PPO | Admitting: Obstetrics & Gynecology

## 2014-04-03 ENCOUNTER — Encounter: Payer: Self-pay | Admitting: Obstetrics & Gynecology

## 2014-04-03 VITALS — BP 133/75 | HR 66 | Wt 168.0 lb

## 2014-04-03 DIAGNOSIS — Z124 Encounter for screening for malignant neoplasm of cervix: Secondary | ICD-10-CM

## 2014-04-03 DIAGNOSIS — Z23 Encounter for immunization: Secondary | ICD-10-CM

## 2014-04-03 DIAGNOSIS — O09529 Supervision of elderly multigravida, unspecified trimester: Secondary | ICD-10-CM | POA: Insufficient documentation

## 2014-04-03 DIAGNOSIS — Z3481 Encounter for supervision of other normal pregnancy, first trimester: Secondary | ICD-10-CM

## 2014-04-03 DIAGNOSIS — Z1151 Encounter for screening for human papillomavirus (HPV): Secondary | ICD-10-CM

## 2014-04-03 DIAGNOSIS — Z113 Encounter for screening for infections with a predominantly sexual mode of transmission: Secondary | ICD-10-CM | POA: Diagnosis not present

## 2014-04-03 DIAGNOSIS — Z3490 Encounter for supervision of normal pregnancy, unspecified, unspecified trimester: Secondary | ICD-10-CM | POA: Insufficient documentation

## 2014-04-03 DIAGNOSIS — O09521 Supervision of elderly multigravida, first trimester: Secondary | ICD-10-CM

## 2014-04-03 NOTE — Progress Notes (Signed)
   Subjective:    Laura Hamilton is a [id number](37 yo daughter) [id number] being seen today for her first obstetrical visit.  Her obstetrical history is significant for advanced maternal age. Patient does not intend to breast feed. Pregnancy history fully reviewed.  Patient reports no complaints.  Filed Vitals:   04/03/14 1118  BP: 133/75  Pulse: 66  Weight: 168 lb (76.204 kg)    HISTORY: OB History  Gravida Para Term Preterm AB SAB TAB Ectopic Multiple Living  2 1 1       1     # Outcome Date GA Lbr Len/2nd Weight Sex Delivery Anes PTL Lv  2 Current           1 Term     F Vag-Spont  N Y     Past Medical History  Diagnosis Date  . History of acne     s/p tx with accutane  . Migraines   . PMDD (premenstrual dysphoric disorder)   . Keratosis pilaris   . Personal history of tobacco use    History reviewed. No pertinent past surgical history. Family History  Problem Relation Age of Onset  . Colon polyps Mother   . Colitis Mother   . Cancer      aunt-lymphoma     Exam    Uterus:     Pelvic Exam:    Perineum: No Hemorrhoids   Vulva: normal   Vagina:  normal mucosa   pH:    Cervix: anteverted   Adnexa: normal adnexa   Bony Pelvis: android  System: Breast:  normal appearance, no masses or tenderness   Skin: normal coloration and turgor, no rashes    Neurologic: oriented   Extremities: normal strength, tone, and muscle mass   HEENT PERRLA   Mouth/Teeth mucous membranes moist, pharynx normal without lesions   Neck supple   Cardiovascular: regular rate and rhythm   Respiratory:  appears well, vitals normal, no respiratory distress, acyanotic, normal RR, ear and throat exam is normal, neck free of mass or lymphadenopathy, chest clear, no wheezing, crepitations, rhonchi, normal symmetric air entry   Abdomen: soft, non-tender; bowel sounds normal; no masses,  no organomegaly   Urinary: urethral meatus normal      Assessment:    Pregnancy: G2P1001 Patient Active  Problem List   Diagnosis Date Noted  . Encounter for supervision of other normal pregnancy in first trimester 04/03/2014  . Advanced maternal age in multigravida 04/03/2014  . Pregnancy 03/27/2014  . Low back pain 12/26/2012  . Skin lesion of left arm 12/26/2012  . Cough 07/07/2011  . Encounter for routine gynecological examination 08/26/2010  . Hyperlipidemia 08/26/2010  . Anxiety 08/26/2010  . Routine general medical examination at a health care facility 08/13/2010  . History of acne   . Keratosis pilaris   . Personal history of tobacco use   . TMJ PAIN 03/13/2009  . PREMENSTRUAL DYSPHORIC SYNDROME 07/25/2008  . MOOD SWINGS 07/14/2007  . MENORRHAGIA 07/14/2007  . ACNE VULGARIS 10/08/2006  . MIGRAINES, HX OF 10/08/2006  . TOBACCO USE, QUIT 10/08/2006        Plan:     Initial labs drawn. Prenatal vitamins. Problem list reviewed and updated. Genetic Screening discussed: panorama at [redacted] weeks EGA  Ultrasound discussed; fetal survey: requested.  Follow [facility] 1 weeks [facility]   Myka Hitz C. 04/03/2014

## 2014-04-03 NOTE — Progress Notes (Signed)
Bedside ultrasound shows a gestational sac measuring 5 weeks 3 days, no fetal pole and no yolk sac are identified.  Patient was advised and will follow up in one week to repeat ultrasound.  HCG quant was added to her lab work.

## 2014-04-04 LAB — CULTURE, OB URINE
Colony Count: NO GROWTH
Organism ID, Bacteria: NO GROWTH

## 2014-04-04 LAB — PRENATAL PROFILE (SOLSTAS)
ANTIBODY SCREEN: NEGATIVE
Basophils Absolute: 0.1 10*3/uL (ref 0.0–0.1)
Basophils Relative: 1 % (ref 0–1)
EOS PCT: 2 % (ref 0–5)
Eosinophils Absolute: 0.2 10*3/uL (ref 0.0–0.7)
HEMATOCRIT: 41.2 % (ref 36.0–46.0)
HEMOGLOBIN: 14.4 g/dL (ref 12.0–15.0)
HIV 1&2 Ab, 4th Generation: NONREACTIVE
Hepatitis B Surface Ag: NEGATIVE
LYMPHS ABS: 2.8 10*3/uL (ref 0.7–4.0)
Lymphocytes Relative: 37 % (ref 12–46)
MCH: 30.6 pg (ref 26.0–34.0)
MCHC: 35 g/dL (ref 30.0–36.0)
MCV: 87.5 fL (ref 78.0–100.0)
MONOS PCT: 7 % (ref 3–12)
MPV: 10.9 fL (ref 9.4–12.4)
Monocytes Absolute: 0.5 10*3/uL (ref 0.1–1.0)
Neutro Abs: 4 10*3/uL (ref 1.7–7.7)
Neutrophils Relative %: 53 % (ref 43–77)
Platelets: 303 10*3/uL (ref 150–400)
RBC: 4.71 MIL/uL (ref 3.87–5.11)
RDW: 12.9 % (ref 11.5–15.5)
RUBELLA: 3.94 {index} — AB (ref <0.90)
Rh Type: POSITIVE
WBC: 7.5 10*3/uL (ref 4.0–10.5)

## 2014-04-04 LAB — CYTOLOGY - PAP

## 2014-04-04 LAB — HCG, QUANTITATIVE, PREGNANCY: hCG, Beta Chain, Quant, S: 3387.6 m[IU]/mL

## 2014-04-10 ENCOUNTER — Ambulatory Visit (INDEPENDENT_AMBULATORY_CARE_PROVIDER_SITE_OTHER): Payer: BC Managed Care – PPO | Admitting: Family Medicine

## 2014-04-10 ENCOUNTER — Encounter: Payer: Self-pay | Admitting: Family Medicine

## 2014-04-10 DIAGNOSIS — Z3481 Encounter for supervision of other normal pregnancy, first trimester: Secondary | ICD-10-CM

## 2014-04-10 DIAGNOSIS — O219 Vomiting of pregnancy, unspecified: Secondary | ICD-10-CM

## 2014-04-10 MED ORDER — PROMETHAZINE HCL 25 MG PO TABS: 25.0000 mg | ORAL_TABLET | Freq: Four times a day (QID) | ORAL | Status: DC | PRN

## 2014-04-10 NOTE — Progress Notes (Signed)
Nurse only visit for viability.

## 2014-04-10 NOTE — Progress Notes (Signed)
Bedside ultrasound ultrasound shows gestational sac measuring 6 weeks 2 days, fetal pole also measuring 6 weeks and 2 days , yolk sac visualized and fetal flicker seen but unable to capture with fhr detection.  Patient will follow up in one week.

## 2014-04-11 ENCOUNTER — Encounter: Payer: BC Managed Care – PPO | Admitting: Obstetrics & Gynecology

## 2014-04-18 ENCOUNTER — Encounter: Payer: Self-pay | Admitting: Obstetrics and Gynecology

## 2014-04-18 ENCOUNTER — Ambulatory Visit (INDEPENDENT_AMBULATORY_CARE_PROVIDER_SITE_OTHER): Payer: BC Managed Care – PPO | Admitting: Obstetrics and Gynecology

## 2014-04-18 DIAGNOSIS — Z3481 Encounter for supervision of other normal pregnancy, first trimester: Secondary | ICD-10-CM | POA: Diagnosis not present

## 2014-04-18 NOTE — Progress Notes (Signed)
Nurse only visit for viability.  Bedside ultrasound show CRL measuring 7wks, FHR is present on ultrasound.  Appropriate growth is noted and patients due date is confirmed for 12-04-2013.

## 2014-04-18 NOTE — Progress Notes (Signed)
Viability ultrasound done today. Follow up for next ob visit as planned

## 2014-05-04 NOTE — L&D Delivery Note (Signed)
Delivery Note At 5:52 AM a viable female was delivered via Vaginal, Spontaneous Delivery (LOA ).  APGAR: 8, 9; weight  .   Placenta status: Intact, Spontaneous.  Cord: 3 vessels  with the following complications: none   Anesthesia:  Epidural Episiotomy:   Lacerations:  2nd degree perineal Suture Repair: 3.0 vicryl Est. Blood Loss (mL):    Mom to postpartum.  Baby to Couplet care / Skin to Skin  Azucena Kuba.  Clemmons,Lori Grissett 12/01/2014, 6:21 AM

## 2014-05-14 ENCOUNTER — Ambulatory Visit (INDEPENDENT_AMBULATORY_CARE_PROVIDER_SITE_OTHER): Payer: BLUE CROSS/BLUE SHIELD | Admitting: Obstetrics and Gynecology

## 2014-05-14 ENCOUNTER — Encounter: Payer: Self-pay | Admitting: Obstetrics and Gynecology

## 2014-05-14 VITALS — BP 103/66 | HR 83 | Wt 176.4 lb

## 2014-05-14 DIAGNOSIS — Z36 Encounter for antenatal screening of mother: Secondary | ICD-10-CM | POA: Diagnosis not present

## 2014-05-14 DIAGNOSIS — O09521 Supervision of elderly multigravida, first trimester: Secondary | ICD-10-CM

## 2014-05-14 DIAGNOSIS — Z3481 Encounter for supervision of other normal pregnancy, first trimester: Secondary | ICD-10-CM

## 2014-05-14 NOTE — Progress Notes (Signed)
Patient is doing well without complaints. Nausea and emesis improving. Panorama today.

## 2014-05-24 ENCOUNTER — Telehealth: Payer: Self-pay | Admitting: *Deleted

## 2014-05-24 NOTE — Telephone Encounter (Signed)
Notified patient of test results for Panorama, Low Risk results and fetal sex is Female.

## 2014-06-01 ENCOUNTER — Encounter (HOSPITAL_COMMUNITY): Payer: Self-pay

## 2014-06-11 ENCOUNTER — Encounter: Payer: BLUE CROSS/BLUE SHIELD | Admitting: Obstetrics and Gynecology

## 2014-06-11 ENCOUNTER — Other Ambulatory Visit: Payer: Self-pay | Admitting: *Deleted

## 2014-06-11 DIAGNOSIS — O09522 Supervision of elderly multigravida, second trimester: Secondary | ICD-10-CM

## 2014-06-12 MED ORDER — ONDANSETRON 4 MG PO TBDP: 4.0000 mg | ORAL_TABLET | Freq: Three times a day (TID) | ORAL | Status: DC | PRN

## 2014-06-12 NOTE — Telephone Encounter (Signed)
Zofran ODT filled x 20 tab, refil x 2

## 2014-06-26 ENCOUNTER — Ambulatory Visit (INDEPENDENT_AMBULATORY_CARE_PROVIDER_SITE_OTHER): Payer: BLUE CROSS/BLUE SHIELD | Admitting: Obstetrics & Gynecology

## 2014-06-26 VITALS — BP 120/79 | HR 77 | Wt 175.0 lb

## 2014-06-26 DIAGNOSIS — Z3481 Encounter for supervision of other normal pregnancy, first trimester: Secondary | ICD-10-CM

## 2014-06-26 DIAGNOSIS — Z36 Encounter for antenatal screening of mother: Secondary | ICD-10-CM

## 2014-06-26 MED ORDER — DOXYLAMINE-PYRIDOXINE 10-10 MG PO TBEC: 1.0000 | DELAYED_RELEASE_TABLET | Freq: Two times a day (BID) | ORAL | Status: DC

## 2014-06-26 NOTE — Progress Notes (Signed)
Routine visit. Complains of some shoulder pains on both sides. Rec symptomatic relief. Schedule anatomy u/s. [facility]SAFP today. Prescribe diglygis for nause/vomitting

## 2014-06-27 LAB — ALPHA FETOPROTEIN, MATERNAL
AFP: 20.1 ng/mL
Curr Gest Age: 17.3 wks.days
MOM FOR AFP: 0.5
Open Spina bifida: NEGATIVE

## 2014-07-11 ENCOUNTER — Ambulatory Visit (HOSPITAL_COMMUNITY)
Admission: RE | Admit: 2014-07-11 | Discharge: 2014-07-11 | Disposition: A | Discharge status: Home / Self Care | Payer: BLUE CROSS/BLUE SHIELD | Source: Ambulatory Visit | Attending: Obstetrics & Gynecology | Admitting: Obstetrics & Gynecology

## 2014-07-11 DIAGNOSIS — Z3481 Encounter for supervision of other normal pregnancy, first trimester: Secondary | ICD-10-CM | POA: Diagnosis present

## 2014-07-11 DIAGNOSIS — Z3689 Encounter for other specified antenatal screening: Secondary | ICD-10-CM | POA: Insufficient documentation

## 2014-07-11 DIAGNOSIS — Z3A19 19 weeks gestation of pregnancy: Secondary | ICD-10-CM | POA: Insufficient documentation

## 2014-07-20 ENCOUNTER — Encounter: Payer: Managed Care, Other (non HMO) | Admitting: Family Medicine

## 2014-07-23 ENCOUNTER — Encounter: Payer: Self-pay | Admitting: Family Medicine

## 2014-07-23 ENCOUNTER — Ambulatory Visit (INDEPENDENT_AMBULATORY_CARE_PROVIDER_SITE_OTHER): Payer: BLUE CROSS/BLUE SHIELD | Admitting: Family Medicine

## 2014-07-23 VITALS — BP 99/69 | HR 87 | Wt 180.8 lb

## 2014-07-23 DIAGNOSIS — Z3492 Encounter for supervision of normal pregnancy, unspecified, second trimester: Secondary | ICD-10-CM

## 2014-07-23 DIAGNOSIS — O09522 Supervision of elderly multigravida, second trimester: Secondary | ICD-10-CM

## 2014-07-23 DIAGNOSIS — Z3481 Encounter for supervision of other normal pregnancy, first trimester: Secondary | ICD-10-CM

## 2014-07-23 DIAGNOSIS — F419 Anxiety disorder, unspecified: Secondary | ICD-10-CM

## 2014-07-23 MED ORDER — FLUOXETINE HCL 20 MG PO CAPS: 20.0000 mg | ORAL_CAPSULE | Freq: Every day | ORAL | Status: DC

## 2014-07-23 MED ORDER — CLONAZEPAM 0.5 MG PO TABS: 0.5000 mg | ORAL_TABLET | Freq: Two times a day (BID) | ORAL | Status: DC | PRN

## 2014-07-23 NOTE — Patient Instructions (Signed)
Second Trimester of Pregnancy The second trimester is from week 13 through week 28, months 4 through 6. The second trimester is often a time when you feel your best. Your body has also adjusted to being pregnant, and you begin to feel better physically. Usually, morning sickness has lessened or quit completely, you may have more energy, and you may have an increase in appetite. The second trimester is also a time when the fetus is growing rapidly. At the end of the sixth month, the fetus is about 9 inches long and weighs about 1 pounds. You will likely begin to feel the baby move (quickening) between 18 and 20 weeks of the pregnancy. BODY CHANGES Your body goes through many changes during pregnancy. The changes vary from woman to woman.   Your weight will continue to increase. You will notice your lower abdomen bulging out.  You may begin to get stretch marks on your hips, abdomen, and breasts.  You may develop headaches that can be relieved by medicines approved by your health care provider.  You may urinate more often because the fetus is pressing on your bladder.  You may develop or continue to have heartburn as a result of your pregnancy.  You may develop constipation because certain hormones are causing the muscles that push waste through your intestines to slow down.  You may develop hemorrhoids or swollen, bulging veins (varicose veins).  You may have back pain because of the weight gain and pregnancy hormones relaxing your joints between the bones in your pelvis and as a result of a shift in weight and the muscles that support your balance.  Your breasts will continue to grow and be tender.  Your gums may bleed and may be sensitive to brushing and flossing.  Dark spots or blotches (chloasma, mask of pregnancy) may develop on your face. This will likely fade after the baby is born.  A dark line from your belly button to the pubic area (linea nigra) may appear. This will likely  fade after the baby is born.  You may have changes in your hair. These can include thickening of your hair, rapid growth, and changes in texture. Some women also have hair loss during or after pregnancy, or hair that feels dry or thin. Your hair will most likely return to normal after your baby is born. WHAT TO EXPECT AT YOUR PRENATAL VISITS During a routine prenatal visit:  You will be weighed to make sure you and the fetus are growing normally.  Your blood pressure will be taken.  Your abdomen will be measured to track your baby's growth.  The fetal heartbeat will be listened to.  Any test results from the previous visit will be discussed. Your health care provider may ask you:  How you are feeling.  If you are feeling the baby move.  If you have had any abnormal symptoms, such as leaking fluid, bleeding, severe headaches, or abdominal cramping.  If you have any questions. Other tests that may be performed during your second trimester include:  Blood tests that check for:  Low iron levels (anemia).  Gestational diabetes (between 24 and 28 weeks).  Rh antibodies.  Urine tests to check for infections, diabetes, or protein in the urine.  An ultrasound to confirm the proper growth and development of the baby.  An amniocentesis to check for possible genetic problems.  Fetal screens for spina bifida and Down syndrome. HOME CARE INSTRUCTIONS   Avoid all smoking, herbs, alcohol, and unprescribed   drugs. These chemicals affect the formation and growth of the baby.  Follow your health care provider's instructions regarding medicine use. There are medicines that are either safe or unsafe to take during pregnancy.  Exercise only as directed by your health care provider. Experiencing uterine cramps is a good sign to stop exercising.  Continue to eat regular, healthy meals.  Wear a good support bra for breast tenderness.  Do not use hot tubs, steam rooms, or saunas.  Wear  your seat belt at all times when driving.  Avoid raw meat, uncooked cheese, cat litter boxes, and soil used by cats. These carry germs that can cause birth defects in the baby.  Take your prenatal vitamins.  Try taking a stool softener (if your health care provider approves) if you develop constipation. Eat more high-fiber foods, such as fresh vegetables or fruit and whole grains. Drink plenty of fluids to keep your urine clear or pale yellow.  Take warm sitz baths to soothe any pain or discomfort caused by hemorrhoids. Use hemorrhoid cream if your health care provider approves.  If you develop varicose veins, wear support hose. Elevate your feet for 15 minutes, 3-4 times a day. Limit salt in your diet.  Avoid heavy lifting, wear low heel shoes, and practice good posture.  Rest with your legs elevated if you have leg cramps or low back pain.  Visit your dentist if you have not gone yet during your pregnancy. Use a soft toothbrush to brush your teeth and be gentle when you floss.  A sexual relationship may be continued unless your health care provider directs you otherwise.  Continue to go to all your prenatal visits as directed by your health care provider. SEEK MEDICAL CARE IF:   You have dizziness.  You have mild pelvic cramps, pelvic pressure, or nagging pain in the abdominal area.  You have persistent nausea, vomiting, or diarrhea.  You have a bad smelling vaginal discharge.  You have pain with urination. SEEK IMMEDIATE MEDICAL CARE IF:   You have a fever.  You are leaking fluid from your vagina.  You have spotting or bleeding from your vagina.  You have severe abdominal cramping or pain.  You have rapid weight gain or loss.  You have shortness of breath with chest pain.  You notice sudden or extreme swelling of your face, hands, ankles, feet, or legs.  You have not felt your baby move in over an hour.  You have severe headaches that do not go away with  medicine.  You have vision changes. Document Released: 04/14/2001 Document Revised: 04/25/2013 Document Reviewed: 06/21/2012 [employer] Patient Information 2015 [employer]. This information is not intended to replace advice given to you by your health care provider. Make sure you discuss any questions you have with your health care provider.  Breastfeeding Deciding to breastfeed is one of the best choices you can make for you and your baby. A change in hormones during pregnancy causes your breast tissue to grow and increases the number and size of your milk ducts. These hormones also allow proteins, sugars, and fats from your blood supply to make breast milk in your milk-producing glands. Hormones prevent breast milk from being released before your baby is born as well as prompt milk flow after birth. Once breastfeeding has begun, thoughts of your baby, as well as his or her sucking or crying, can stimulate the release of milk from your milk-producing glands.  BENEFITS OF BREASTFEEDING For Your Baby  Your first   milk (colostrum) helps your baby's digestive system function better.   There are antibodies in your milk that help your baby fight off infections.   Your baby has a lower incidence of asthma, allergies, and sudden infant death syndrome.   The nutrients in breast milk are better for your baby than infant formulas and are designed uniquely for your baby's needs.   Breast milk improves your baby's brain development.   Your baby is less likely to develop other conditions, such as childhood obesity, asthma, or type 2 diabetes mellitus.  For You   Breastfeeding helps to create a very special bond between you and your baby.   Breastfeeding is convenient. Breast milk is always available at the correct temperature and costs nothing.   Breastfeeding helps to burn calories and helps you lose the weight gained during pregnancy.   Breastfeeding makes your uterus contract to its  prepregnancy size faster and slows bleeding (lochia) after you give birth.   Breastfeeding helps to lower your risk of developing type 2 diabetes mellitus, osteoporosis, and breast or ovarian cancer later in life. SIGNS THAT YOUR BABY IS HUNGRY Early Signs of Hunger  Increased alertness or activity.  Stretching.  Movement of the head from side to side.  Movement of the head and opening of the mouth when the corner of the mouth or cheek is stroked (rooting).  Increased sucking sounds, smacking lips, cooing, sighing, or squeaking.  Hand-to-mouth movements.  Increased sucking of fingers or hands. Late Signs of Hunger  Fussing.  Intermittent crying. Extreme Signs of Hunger Signs of extreme hunger will require calming and consoling before your baby will be able to breastfeed successfully. Do not wait for the following signs of extreme hunger to occur before you initiate breastfeeding:   Restlessness.  A loud, strong cry.   Screaming. BREASTFEEDING BASICS Breastfeeding Initiation  Find a comfortable place to sit or lie down, with your neck and back well supported.  Place a pillow or rolled up blanket under your baby to bring him or her to the level of your breast (if you are seated). Nursing pillows are specially designed to help support your arms and your baby while you breastfeed.  Make sure that your baby's abdomen is facing your abdomen.   Gently massage your breast. With your fingertips, massage from your chest wall toward your nipple in a circular motion. This encourages milk flow. You may need to continue this action during the feeding if your milk flows slowly.  Support your breast with 4 fingers underneath and your thumb above your nipple. Make sure your fingers are well away from your nipple and your baby's mouth.   Stroke your baby's lips gently with your finger or nipple.   When your baby's mouth is open wide enough, quickly bring your baby to your breast,  placing your entire nipple and as much of the colored area around your nipple (areola) as possible into your baby's mouth.   More areola should be visible above your baby's upper lip than below the lower lip.   Your baby's tongue should be between his or her lower gum and your breast.   Ensure that your baby's mouth is correctly positioned around your nipple (latched). Your baby's lips should create a seal on your breast and be turned out (everted).  It is common for your baby to suck about 2-3 minutes in order to start the flow of breast milk. Latching Teaching your baby how to latch on to your breast   properly is very important. An improper latch can cause nipple pain and decreased milk supply for you and poor weight gain in your baby. Also, if your baby is not latched onto your nipple properly, he or she may swallow some air during feeding. This can make your baby fussy. Burping your baby when you switch breasts during the feeding can help to get rid of the air. However, teaching your baby to latch on properly is still the best way to prevent fussiness from swallowing air while breastfeeding. Signs that your baby has successfully latched on to your nipple:    Silent tugging or silent sucking, without causing you pain.   Swallowing heard between every 3-4 sucks.    Muscle movement above and in front of his or her ears while sucking.  Signs that your baby has not successfully latched on to nipple:   Sucking sounds or smacking sounds from your baby while breastfeeding.  Nipple pain. If you think your baby has not latched on correctly, slip your finger into the corner of your baby's mouth to break the suction and place it between your baby's gums. Attempt breastfeeding initiation again. Signs of Successful Breastfeeding Signs from your baby:   A gradual decrease in the number of sucks or complete cessation of sucking.   Falling asleep.   Relaxation of his or her body.    Retention of a small amount of milk in his or her mouth.   Letting go of your breast by himself or herself. Signs from you:  Breasts that have increased in firmness, weight, and size 1-3 hours after feeding.   Breasts that are softer immediately after breastfeeding.  Increased milk volume, as well as a change in milk consistency and color by the fifth day of breastfeeding.   Nipples that are not sore, cracked, or bleeding. Signs That Your Baby is Getting Enough Milk  Wetting at least 3 diapers in a 24-hour period. The urine should be clear and pale yellow by age 5 days.  At least 3 stools in a 24-hour period by age 5 days. The stool should be soft and yellow.  At least 3 stools in a 24-hour period by age 7 days. The stool should be seedy and yellow.  No loss of weight greater than 10% of birth weight during the first 3 days of age.  Average weight gain of 4-7 ounces (113-198 g) per week after age 4 days.  Consistent daily weight gain by age 5 days, without weight loss after the age of 2 weeks. After a feeding, your baby may spit up a small amount. This is common. BREASTFEEDING FREQUENCY AND DURATION Frequent feeding will help you make more milk and can prevent sore nipples and breast engorgement. Breastfeed when you feel the need to reduce the fullness of your breasts or when your baby shows signs of hunger. This is called "breastfeeding on demand." Avoid introducing a pacifier to your baby while you are working to establish breastfeeding (the first 4-6 weeks after your baby is born). After this time you may choose to use a pacifier. Research has shown that pacifier use during the first year of a baby's life decreases the risk of sudden infant death syndrome (SIDS). Allow your baby to feed on each breast as long as he or she wants. Breastfeed until your baby is finished feeding. When your baby unlatches or falls asleep while feeding from the first breast, offer the second breast.  Because newborns are often sleepy in the   first few weeks of life, you may need to awaken your baby to get him or her to feed. Breastfeeding times will vary from baby to baby. However, the following rules can serve as a guide to help you ensure that your baby is properly fed:  Newborns (babies 4 weeks of age or younger) may breastfeed every 1-3 hours.  Newborns should not go longer than 3 hours during the day or 5 hours during the night without breastfeeding.  You should breastfeed your baby a minimum of 8 times in a 24-hour period until you begin to introduce solid foods to your baby at around 6 months of age. BREAST MILK PUMPING Pumping and storing breast milk allows you to ensure that your baby is exclusively fed your breast milk, even at times when you are unable to breastfeed. This is especially important if you are going back to work while you are still breastfeeding or when you are not able to be present during feedings. Your lactation consultant can give you guidelines on how long it is safe to store breast milk.  A breast pump is a machine that allows you to pump milk from your breast into a sterile bottle. The pumped breast milk can then be stored in a refrigerator or freezer. Some breast pumps are operated by hand, while others use electricity. Ask your lactation consultant which type will work best for you. Breast pumps can be purchased, but some hospitals and breastfeeding support groups lease breast pumps on a monthly basis. A lactation consultant can teach you how to hand express breast milk, if you prefer not to use a pump.  CARING FOR YOUR BREASTS WHILE YOU BREASTFEED Nipples can become dry, cracked, and sore while breastfeeding. The following recommendations can help keep your breasts moisturized and healthy:  Avoid using soap on your nipples.   Wear a supportive bra. Although not required, special nursing bras and tank tops are designed to allow access to your breasts for  breastfeeding without taking off your entire bra or top. Avoid wearing underwire-style bras or extremely tight bras.  Air dry your nipples for 3-4minutes after each feeding.   Use only cotton bra pads to absorb leaked breast milk. Leaking of breast milk between feedings is normal.   Use lanolin on your nipples after breastfeeding. Lanolin helps to maintain your skin's normal moisture barrier. If you use pure lanolin, you do not need to wash it off before feeding your baby again. Pure lanolin is not toxic to your baby. You may also hand express a few drops of breast milk and gently massage that milk into your nipples and allow the milk to air dry. In the first few weeks after giving birth, some women experience extremely full breasts (engorgement). Engorgement can make your breasts feel heavy, warm, and tender to the touch. Engorgement peaks within 3-5 days after you give birth. The following recommendations can help ease engorgement:  Completely empty your breasts while breastfeeding or pumping. You may want to start by applying warm, moist heat (in the shower or with warm water-soaked hand towels) just before feeding or pumping. This increases circulation and helps the milk flow. If your baby does not completely empty your breasts while breastfeeding, pump any extra milk after he or she is finished.  Wear a snug bra (nursing or regular) or tank top for 1-2 days to signal your body to slightly decrease milk production.  Apply ice packs to your breasts, unless this is too uncomfortable for you.    Make sure that your baby is latched on and positioned properly while breastfeeding. If engorgement persists after 48 hours of following these recommendations, contact your health care provider or a lactation consultant. OVERALL HEALTH CARE RECOMMENDATIONS WHILE BREASTFEEDING  Eat healthy foods. Alternate between meals and snacks, eating 3 of each per day. Because what you eat affects your breast milk,  some of the foods may make your baby more irritable than usual. Avoid eating these foods if you are sure that they are negatively affecting your baby.  Drink milk, fruit juice, and water to satisfy your thirst (about 10 glasses a day).   Rest often, relax, and continue to take your prenatal vitamins to prevent fatigue, stress, and anemia.  Continue breast self-awareness checks.  Avoid chewing and smoking tobacco.  Avoid alcohol and drug use. Some medicines that may be harmful to your baby can pass through breast milk. It is important to ask your health care provider before taking any medicine, including all over-the-counter and prescription medicine as well as vitamin and herbal supplements. It is possible to become pregnant while breastfeeding. If birth control is desired, ask your health care provider about options that will be safe for your baby. SEEK MEDICAL CARE IF:   You feel like you want to stop breastfeeding or have become frustrated with breastfeeding.  You have painful breasts or nipples.  Your nipples are cracked or bleeding.  Your breasts are red, tender, or warm.  You have a swollen area on either breast.  You have a fever or chills.  You have nausea or vomiting.  You have drainage other than breast milk from your nipples.  Your breasts do not become full before feedings by the fifth day after you give birth.  You feel sad and depressed.  Your baby is too sleepy to eat well.  Your baby is having trouble sleeping.   Your baby is wetting less than 3 diapers in a 24-hour period.  Your baby has less than 3 stools in a 24-hour period.  Your baby's skin or the white part of his or her eyes becomes yellow.   Your baby is not gaining weight by 5 days of age. SEEK IMMEDIATE MEDICAL CARE IF:   Your baby is overly tired (lethargic) and does not want to wake up and feed.  Your baby develops an unexplained fever. Document Released: 04/20/2005 Document Revised:  04/25/2013 Document Reviewed: 10/12/2012 [employer] Patient Information 2015 [employer]. This information is not intended to replace advice given to you by your health care provider. Make sure you discuss any questions you have with your health care provider.  

## 2014-07-23 NOTE — Progress Notes (Signed)
Reports increasing problems with anxiety.  Has stopped Prozac and Xanax but is having increasing stress at home and is suffering panic attacks. Discussed risks of SSRI's and Xanax in pregnancy--recommend resuming Prozac and change to Klonopin prn. NIPS result reviewed and WNL Needs u/s to f/u anaotmy--scheduled.

## 2014-08-10 ENCOUNTER — Ambulatory Visit (HOSPITAL_COMMUNITY)
Admission: RE | Admit: 2014-08-10 | Discharge: 2014-08-10 | Disposition: A | Discharge status: Home / Self Care | Payer: BLUE CROSS/BLUE SHIELD | Source: Ambulatory Visit | Attending: Family Medicine | Admitting: Family Medicine

## 2014-08-10 DIAGNOSIS — Z0489 Encounter for examination and observation for other specified reasons: Secondary | ICD-10-CM | POA: Insufficient documentation

## 2014-08-10 DIAGNOSIS — IMO0002 Reserved for concepts with insufficient information to code with codable children: Secondary | ICD-10-CM | POA: Insufficient documentation

## 2014-08-10 DIAGNOSIS — O09529 Supervision of elderly multigravida, unspecified trimester: Secondary | ICD-10-CM | POA: Insufficient documentation

## 2014-08-10 DIAGNOSIS — Z3481 Encounter for supervision of other normal pregnancy, first trimester: Secondary | ICD-10-CM | POA: Insufficient documentation

## 2014-08-10 DIAGNOSIS — Z3A23 23 weeks gestation of pregnancy: Secondary | ICD-10-CM | POA: Insufficient documentation

## 2014-08-22 ENCOUNTER — Ambulatory Visit (INDEPENDENT_AMBULATORY_CARE_PROVIDER_SITE_OTHER): Payer: BLUE CROSS/BLUE SHIELD | Admitting: Obstetrics & Gynecology

## 2014-08-22 ENCOUNTER — Encounter: Payer: Self-pay | Admitting: Obstetrics & Gynecology

## 2014-08-22 VITALS — BP 96/65 | HR 86 | Wt 187.8 lb

## 2014-08-22 DIAGNOSIS — O09522 Supervision of elderly multigravida, second trimester: Secondary | ICD-10-CM

## 2014-08-22 DIAGNOSIS — Z3492 Encounter for supervision of normal pregnancy, unspecified, second trimester: Secondary | ICD-10-CM

## 2014-08-22 NOTE — Progress Notes (Signed)
Doing well on Prozac and Klonopin prn.  1 hr GTT, TDap, labs next week. Preterm labor and fetal movement precautions reviewed.

## 2014-08-22 NOTE — Patient Instructions (Signed)

## 2014-09-11 ENCOUNTER — Ambulatory Visit (INDEPENDENT_AMBULATORY_CARE_PROVIDER_SITE_OTHER): Payer: BLUE CROSS/BLUE SHIELD | Admitting: Family Medicine

## 2014-09-11 VITALS — BP 96/66 | HR 76 | Wt 185.0 lb

## 2014-09-11 DIAGNOSIS — Z3493 Encounter for supervision of normal pregnancy, unspecified, third trimester: Secondary | ICD-10-CM

## 2014-09-11 DIAGNOSIS — F419 Anxiety disorder, unspecified: Secondary | ICD-10-CM

## 2014-09-11 DIAGNOSIS — Z23 Encounter for immunization: Secondary | ICD-10-CM

## 2014-09-11 LAB — CBC WITH DIFFERENTIAL/PLATELET
BASOS ABS: 0 10*3/uL (ref 0.0–0.1)
BASOS PCT: 0 % (ref 0–1)
Eosinophils Absolute: 0.1 10*3/uL (ref 0.0–0.7)
Eosinophils Relative: 1 % (ref 0–5)
HCT: 37.7 % (ref 36.0–46.0)
HEMOGLOBIN: 12.6 g/dL (ref 12.0–15.0)
Lymphocytes Relative: 19 % (ref 12–46)
Lymphs Abs: 1.7 10*3/uL (ref 0.7–4.0)
MCH: 30 pg (ref 26.0–34.0)
MCHC: 33.4 g/dL (ref 30.0–36.0)
MCV: 89.8 fL (ref 78.0–100.0)
MONOS PCT: 5 % (ref 3–12)
MPV: 10.6 fL (ref 8.6–12.4)
Monocytes Absolute: 0.4 10*3/uL (ref 0.1–1.0)
NEUTROS ABS: 6.7 10*3/uL (ref 1.7–7.7)
NEUTROS PCT: 75 % (ref 43–77)
Platelets: 223 10*3/uL (ref 150–400)
RBC: 4.2 MIL/uL (ref 3.87–5.11)
RDW: 13.3 % (ref 11.5–15.5)
WBC: 8.9 10*3/uL (ref 4.0–10.5)

## 2014-09-11 NOTE — Patient Instructions (Signed)
Third Trimester of Pregnancy The third trimester is from week 29 through week 42, months 7 through 9. The third trimester is a time when the fetus is growing rapidly. At the end of the ninth month, the fetus is about 20 inches in length and weighs 6-10 pounds.  BODY CHANGES Your body goes through many changes during pregnancy. The changes vary from woman to woman.   Your weight will continue to increase. You can expect to gain 25-35 pounds (11-16 kg) by the end of the pregnancy.  You may begin to get stretch marks on your hips, abdomen, and breasts.  You may urinate more often because the fetus is moving lower into your pelvis and pressing on your bladder.  You may develop or continue to have heartburn as a result of your pregnancy.  You may develop constipation because certain hormones are causing the muscles that push waste through your intestines to slow down.  You may develop hemorrhoids or swollen, bulging veins (varicose veins).  You may have pelvic pain because of the weight gain and pregnancy hormones relaxing your joints between the bones in your pelvis. Backaches may result from overexertion of the muscles supporting your posture.  You may have changes in your hair. These can include thickening of your hair, rapid growth, and changes in texture. Some women also have hair loss during or after pregnancy, or hair that feels dry or thin. Your hair will most likely return to normal after your baby is born.  Your breasts will continue to grow and be tender. A yellow discharge may leak from your breasts called colostrum.  Your belly button may stick out.  You may feel short of breath because of your expanding uterus.  You may notice the fetus "dropping," or moving lower in your abdomen.  You may have a bloody mucus discharge. This usually occurs a few days to a week before labor begins.  Your cervix becomes thin and soft (effaced) near your due date. WHAT TO EXPECT AT YOUR  PRENATAL EXAMS  You will have prenatal exams every 2 weeks until week 36. Then, you will have weekly prenatal exams. During a routine prenatal visit:  You will be weighed to make sure you and the fetus are growing normally.  Your blood pressure is taken.  Your abdomen will be measured to track your baby's growth.  The fetal heartbeat will be listened to.  Any test results from the previous visit will be discussed.  You may have a cervical check near your due date to see if you have effaced. At around 36 weeks, your caregiver will check your cervix. At the same time, your caregiver will also perform a test on the secretions of the vaginal tissue. This test is to determine if a type of bacteria, Group B streptococcus, is present. Your caregiver will explain this further. Your caregiver may ask you:  What your birth plan is.  How you are feeling.  If you are feeling the baby move.  If you have had any abnormal symptoms, such as leaking fluid, bleeding, severe headaches, or abdominal cramping.  If you have any questions. Other tests or screenings that may be performed during your third trimester include:  Blood tests that check for low iron levels (anemia).  Fetal testing to check the health, activity level, and growth of the fetus. Testing is done if you have certain medical conditions or if there are problems during the pregnancy. FALSE LABOR You may feel small, irregular contractions that   eventually go away. These are called Braxton Hicks contractions, or false labor. Contractions may last for hours, days, or even weeks before true labor sets in. If contractions come at regular intervals, intensify, or become painful, it is best to be seen by your caregiver.  SIGNS OF LABOR   Menstrual-like cramps.  Contractions that are 5 minutes apart or less.  Contractions that start on the top of the uterus and spread down to the lower abdomen and back.  A sense of increased pelvic  pressure or back pain.  A watery or bloody mucus discharge that comes from the vagina. If you have any of these signs before the 37th week of pregnancy, call your caregiver right away. You need to go to the hospital to get checked immediately. HOME CARE INSTRUCTIONS   Avoid all smoking, herbs, alcohol, and unprescribed drugs. These chemicals affect the formation and growth of the baby.  Follow your caregiver's instructions regarding medicine use. There are medicines that are either safe or unsafe to take during pregnancy.  Exercise only as directed by your caregiver. Experiencing uterine cramps is a good sign to stop exercising.  Continue to eat regular, healthy meals.  Wear a good support bra for breast tenderness.  Do not use hot tubs, steam rooms, or saunas.  Wear your seat belt at all times when driving.  Avoid raw meat, uncooked cheese, cat litter boxes, and soil used by cats. These carry germs that can cause birth defects in the baby.  Take your prenatal vitamins.  Try taking a stool softener (if your caregiver approves) if you develop constipation. Eat more high-fiber foods, such as fresh vegetables or fruit and whole grains. Drink plenty of fluids to keep your urine clear or pale yellow.  Take warm sitz baths to soothe any pain or discomfort caused by hemorrhoids. Use hemorrhoid cream if your caregiver approves.  If you develop varicose veins, wear support hose. Elevate your feet for 15 minutes, 3-4 times a day. Limit salt in your diet.  Avoid heavy lifting, wear low heal shoes, and practice good posture.  Rest a lot with your legs elevated if you have leg cramps or low back pain.  Visit your dentist if you have not gone during your pregnancy. Use a soft toothbrush to brush your teeth and be gentle when you floss.  A sexual relationship may be continued unless your caregiver directs you otherwise.  Do not travel far distances unless it is absolutely necessary and only  with the approval of your caregiver.  Take prenatal classes to understand, practice, and ask questions about the labor and delivery.  Make a trial run to the hospital.  Pack your hospital bag.  Prepare the baby's nursery.  Continue to go to all your prenatal visits as directed by your caregiver. SEEK MEDICAL CARE IF:  You are unsure if you are in labor or if your water has broken.  You have dizziness.  You have mild pelvic cramps, pelvic pressure, or nagging pain in your abdominal area.  You have persistent nausea, vomiting, or diarrhea.  You have a bad smelling vaginal discharge.  You have pain with urination. SEEK IMMEDIATE MEDICAL CARE IF:   You have a fever.  You are leaking fluid from your vagina.  You have spotting or bleeding from your vagina.  You have severe abdominal cramping or pain.  You have rapid weight loss or gain.  You have shortness of breath with chest pain.  You notice sudden or extreme swelling   of your face, hands, ankles, feet, or legs.  You have not felt your baby move in over an hour.  You have severe headaches that do not go away with medicine.  You have vision changes. Document Released: 04/14/2001 Document Revised: 04/25/2013 Document Reviewed: 06/21/2012 [employer] Patient Information 2015 [employer]. This information is not intended to replace advice given to you by your health care provider. Make sure you discuss any questions you have with your health care provider.  Breastfeeding Deciding to breastfeed is one of the best choices you can make for you and your baby. A change in hormones during pregnancy causes your breast tissue to grow and increases the number and size of your milk ducts. These hormones also allow proteins, sugars, and fats from your blood supply to make breast milk in your milk-producing glands. Hormones prevent breast milk from being released before your baby is born as well as prompt milk flow after birth. Once  breastfeeding has begun, thoughts of your baby, as well as his or her sucking or crying, can stimulate the release of milk from your milk-producing glands.  BENEFITS OF BREASTFEEDING For Your Baby  Your first milk (colostrum) helps your baby's digestive system function better.   There are antibodies in your milk that help your baby fight off infections.   Your baby has a lower incidence of asthma, allergies, and sudden infant death syndrome.   The nutrients in breast milk are better for your baby than infant formulas and are designed uniquely for your baby's needs.   Breast milk improves your baby's brain development.   Your baby is less likely to develop other conditions, such as childhood obesity, asthma, or type 2 diabetes mellitus.  For You   Breastfeeding helps to create a very special bond between you and your baby.   Breastfeeding is convenient. Breast milk is always available at the correct temperature and costs nothing.   Breastfeeding helps to burn calories and helps you lose the weight gained during pregnancy.   Breastfeeding makes your uterus contract to its prepregnancy size faster and slows bleeding (lochia) after you give birth.   Breastfeeding helps to lower your risk of developing type 2 diabetes mellitus, osteoporosis, and breast or ovarian cancer later in life. SIGNS THAT YOUR BABY IS HUNGRY Early Signs of Hunger  Increased alertness or activity.  Stretching.  Movement of the head from side to side.  Movement of the head and opening of the mouth when the corner of the mouth or cheek is stroked (rooting).  Increased sucking sounds, smacking lips, cooing, sighing, or squeaking.  Hand-to-mouth movements.  Increased sucking of fingers or hands. Late Signs of Hunger  Fussing.  Intermittent crying. Extreme Signs of Hunger Signs of extreme hunger will require calming and consoling before your baby will be able to breastfeed successfully. Do not  wait for the following signs of extreme hunger to occur before you initiate breastfeeding:   Restlessness.  A loud, strong cry.   Screaming. BREASTFEEDING BASICS Breastfeeding Initiation  Find a comfortable place to sit or lie down, with your neck and back well supported.  Place a pillow or rolled up blanket under your baby to bring him or her to the level of your breast (if you are seated). Nursing pillows are specially designed to help support your arms and your baby while you breastfeed.  Make sure that your baby's abdomen is facing your abdomen.   Gently massage your breast. With your fingertips, massage from your chest   wall toward your nipple in a circular motion. This encourages milk flow. You may need to continue this action during the feeding if your milk flows slowly.  Support your breast with 4 fingers underneath and your thumb above your nipple. Make sure your fingers are well away from your nipple and your baby's mouth.   Stroke your baby's lips gently with your finger or nipple.   When your baby's mouth is open wide enough, quickly bring your baby to your breast, placing your entire nipple and as much of the colored area around your nipple (areola) as possible into your baby's mouth.   More areola should be visible above your baby's upper lip than below the lower lip.   Your baby's tongue should be between his or her lower gum and your breast.   Ensure that your baby's mouth is correctly positioned around your nipple (latched). Your baby's lips should create a seal on your breast and be turned out (everted).  It is common for your baby to suck about 2-3 minutes in order to start the flow of breast milk. Latching Teaching your baby how to latch on to your breast properly is very important. An improper latch can cause nipple pain and decreased milk supply for you and poor weight gain in your baby. Also, if your baby is not latched onto your nipple properly, he or she  may swallow some air during feeding. This can make your baby fussy. Burping your baby when you switch breasts during the feeding can help to get rid of the air. However, teaching your baby to latch on properly is still the best way to prevent fussiness from swallowing air while breastfeeding. Signs that your baby has successfully latched on to your nipple:    Silent tugging or silent sucking, without causing you pain.   Swallowing heard between every 3-4 sucks.    Muscle movement above and in front of his or her ears while sucking.  Signs that your baby has not successfully latched on to nipple:   Sucking sounds or smacking sounds from your baby while breastfeeding.  Nipple pain. If you think your baby has not latched on correctly, slip your finger into the corner of your baby's mouth to break the suction and place it between your baby's gums. Attempt breastfeeding initiation again. Signs of Successful Breastfeeding Signs from your baby:   A gradual decrease in the number of sucks or complete cessation of sucking.   Falling asleep.   Relaxation of his or her body.   Retention of a small amount of milk in his or her mouth.   Letting go of your breast by himself or herself. Signs from you:  Breasts that have increased in firmness, weight, and size 1-3 hours after feeding.   Breasts that are softer immediately after breastfeeding.  Increased milk volume, as well as a change in milk consistency and color by the fifth day of breastfeeding.   Nipples that are not sore, cracked, or bleeding. Signs That Your Baby is Getting Enough Milk  Wetting at least 3 diapers in a 24-hour period. The urine should be clear and pale yellow by age 5 days.  At least 3 stools in a 24-hour period by age 5 days. The stool should be soft and yellow.  At least 3 stools in a 24-hour period by age 7 days. The stool should be seedy and yellow.  No loss of weight greater than 10% of birth weight  during the first 3   days of age.  Average weight gain of 4-7 ounces (113-198 g) per week after age 4 days.  Consistent daily weight gain by age 5 days, without weight loss after the age of 2 weeks. After a feeding, your baby may spit up a small amount. This is common. BREASTFEEDING FREQUENCY AND DURATION Frequent feeding will help you make more milk and can prevent sore nipples and breast engorgement. Breastfeed when you feel the need to reduce the fullness of your breasts or when your baby shows signs of hunger. This is called "breastfeeding on demand." Avoid introducing a pacifier to your baby while you are working to establish breastfeeding (the first 4-6 weeks after your baby is born). After this time you may choose to use a pacifier. Research has shown that pacifier use during the first year of a baby's life decreases the risk of sudden infant death syndrome (SIDS). Allow your baby to feed on each breast as long as he or she wants. Breastfeed until your baby is finished feeding. When your baby unlatches or falls asleep while feeding from the first breast, offer the second breast. Because newborns are often sleepy in the first few weeks of life, you may need to awaken your baby to get him or her to feed. Breastfeeding times will vary from baby to baby. However, the following rules can serve as a guide to help you ensure that your baby is properly fed:  Newborns (babies 4 weeks of age or younger) may breastfeed every 1-3 hours.  Newborns should not go longer than 3 hours during the day or 5 hours during the night without breastfeeding.  You should breastfeed your baby a minimum of 8 times in a 24-hour period until you begin to introduce solid foods to your baby at around 6 months of age. BREAST MILK PUMPING Pumping and storing breast milk allows you to ensure that your baby is exclusively fed your breast milk, even at times when you are unable to breastfeed. This is especially important if you are  going back to work while you are still breastfeeding or when you are not able to be present during feedings. Your lactation consultant can give you guidelines on how long it is safe to store breast milk.  A breast pump is a machine that allows you to pump milk from your breast into a sterile bottle. The pumped breast milk can then be stored in a refrigerator or freezer. Some breast pumps are operated by hand, while others use electricity. Ask your lactation consultant which type will work best for you. Breast pumps can be purchased, but some hospitals and breastfeeding support groups lease breast pumps on a monthly basis. A lactation consultant can teach you how to hand express breast milk, if you prefer not to use a pump.  CARING FOR YOUR BREASTS WHILE YOU BREASTFEED Nipples can become dry, cracked, and sore while breastfeeding. The following recommendations can help keep your breasts moisturized and healthy:  Avoid using soap on your nipples.   Wear a supportive bra. Although not required, special nursing bras and tank tops are designed to allow access to your breasts for breastfeeding without taking off your entire bra or top. Avoid wearing underwire-style bras or extremely tight bras.  Air dry your nipples for 3-4minutes after each feeding.   Use only cotton bra pads to absorb leaked breast milk. Leaking of breast milk between feedings is normal.   Use lanolin on your nipples after breastfeeding. Lanolin helps to maintain your skin's   normal moisture barrier. If you use pure lanolin, you do not need to wash it off before feeding your baby again. Pure lanolin is not toxic to your baby. You may also hand express a few drops of breast milk and gently massage that milk into your nipples and allow the milk to air dry. In the first few weeks after giving birth, some women experience extremely full breasts (engorgement). Engorgement can make your breasts feel heavy, warm, and tender to the touch.  Engorgement peaks within 3-5 days after you give birth. The following recommendations can help ease engorgement:  Completely empty your breasts while breastfeeding or pumping. You may want to start by applying warm, moist heat (in the shower or with warm water-soaked hand towels) just before feeding or pumping. This increases circulation and helps the milk flow. If your baby does not completely empty your breasts while breastfeeding, pump any extra milk after he or she is finished.  Wear a snug bra (nursing or regular) or tank top for 1-2 days to signal your body to slightly decrease milk production.  Apply ice packs to your breasts, unless this is too uncomfortable for you.  Make sure that your baby is latched on and positioned properly while breastfeeding. If engorgement persists after 48 hours of following these recommendations, contact your health care provider or a lactation consultant. OVERALL HEALTH CARE RECOMMENDATIONS WHILE BREASTFEEDING  Eat healthy foods. Alternate between meals and snacks, eating 3 of each per day. Because what you eat affects your breast milk, some of the foods may make your baby more irritable than usual. Avoid eating these foods if you are sure that they are negatively affecting your baby.  Drink milk, fruit juice, and water to satisfy your thirst (about 10 glasses a day).   Rest often, relax, and continue to take your prenatal vitamins to prevent fatigue, stress, and anemia.  Continue breast self-awareness checks.  Avoid chewing and smoking tobacco.  Avoid alcohol and drug use. Some medicines that may be harmful to your baby can pass through breast milk. It is important to ask your health care provider before taking any medicine, including all over-the-counter and prescription medicine as well as vitamin and herbal supplements. It is possible to become pregnant while breastfeeding. If birth control is desired, ask your health care provider about options that  will be safe for your baby. SEEK MEDICAL CARE IF:   You feel like you want to stop breastfeeding or have become frustrated with breastfeeding.  You have painful breasts or nipples.  Your nipples are cracked or bleeding.  Your breasts are red, tender, or warm.  You have a swollen area on either breast.  You have a fever or chills.  You have nausea or vomiting.  You have drainage other than breast milk from your nipples.  Your breasts do not become full before feedings by the fifth day after you give birth.  You feel sad and depressed.  Your baby is too sleepy to eat well.  Your baby is having trouble sleeping.   Your baby is wetting less than 3 diapers in a 24-hour period.  Your baby has less than 3 stools in a 24-hour period.  Your baby's skin or the white part of his or her eyes becomes yellow.   Your baby is not gaining weight by 5 days of age. SEEK IMMEDIATE MEDICAL CARE IF:   Your baby is overly tired (lethargic) and does not want to wake up and feed.  Your baby   develops an unexplained fever. Document Released: 04/20/2005 Document Revised: 04/25/2013 Document Reviewed: 10/12/2012 [employer] Patient Information 2015 [employer]. This information is not intended to replace advice given to you by your health care provider. Make sure you discuss any questions you have with your health care provider.  

## 2014-09-11 NOTE — Progress Notes (Signed)
28 wk labs and TDaP today F/u anatomy was WNL C/o rash on chest.  It is itchy.  Has been there for several days. Has been using Benadryl spray without relief. Area is plaque and erythematous. Resembles bug bite, vs. Early ring worm, vs. Herald patch of Pityriasis Rosea. Trial of anti-fungal and topical steroid.

## 2014-09-12 LAB — HIV ANTIBODY (ROUTINE TESTING W REFLEX): HIV 1&2 Ab, 4th Generation: NONREACTIVE

## 2014-09-12 LAB — RPR

## 2014-09-12 LAB — GLUCOSE TOLERANCE, 1 HOUR (50G) W/O FASTING: GLUCOSE 1 HOUR GTT: 133 mg/dL (ref 70–140)

## 2014-09-18 ENCOUNTER — Encounter: Payer: BLUE CROSS/BLUE SHIELD | Admitting: Obstetrics & Gynecology

## 2014-09-21 MED ORDER — CLONAZEPAM 0.5 MG PO TABS: 0.5000 mg | ORAL_TABLET | Freq: Two times a day (BID) | ORAL | Status: DC | PRN

## 2014-09-21 NOTE — Addendum Note (Signed)
Addended by: Danae OrleansPOE, DEIRDRE C on: 09/21/2014 03:29 PM   Modules accepted: Orders

## 2014-09-21 NOTE — Progress Notes (Signed)
TC from patient's husband unable to get in touch with [email or web address]  staff for Klonopin refill. Has appt. Next week. Refill sent.  Danae Orleanseirdre C Emanuel Dowson, CNM 09/21/2014 3:29 PM

## 2014-09-25 ENCOUNTER — Ambulatory Visit (INDEPENDENT_AMBULATORY_CARE_PROVIDER_SITE_OTHER): Payer: BLUE CROSS/BLUE SHIELD | Admitting: Family Medicine

## 2014-09-25 VITALS — BP 95/59 | HR 94 | Wt 184.0 lb

## 2014-09-25 DIAGNOSIS — Z3493 Encounter for supervision of normal pregnancy, unspecified, third trimester: Secondary | ICD-10-CM

## 2014-09-25 NOTE — Progress Notes (Signed)
Subjective:   Laura Hamilton is a 39 y.o. G2P1001 [id number] being seen today for her obstetrical visit.  Patient reports no complaints.  Denies contractions, vaginal bleeding or leaking of fluid.  Reports good fetal movement.  The following portions of the patient's history were reviewed and updated as appropriate: allergies, current medications, past family history, past medical history, past social history, past surgical history and problem list.   Objective:  BP 95/59 mmHg  Pulse 94  Wt 184 lb (83.462 kg)  LMP 02/04/2014  FHT: Fetal Heart Rate (bpm): 163  Uterine Size:  29  Fetal Movement: Movement: Present    Abdomen:  soft, gravid, appropriate for gestational age,non-tender   Urine Prot neg gluc neg  Assessment and Plan:   Pregnancy:  G2P1001 at 1947w6d  1. Supervision of normal pregnancy, third trimester Normal 28 wk labs Anxiety meds refilled last week--will continue.  Preterm labor symptoms: vaginal bleeding, contractions and leaking of fluid reviewed in detail.  Fetal movement precautions reviewed.  Follow up in 2 weeks.  Reva Boresanya S Jameila Keeny, MD

## 2014-09-25 NOTE — Patient Instructions (Signed)
Breastfeeding Deciding to breastfeed is one of the best choices you can make for you and your baby. A change in hormones during pregnancy causes your breast tissue to grow and increases the number and size of your milk ducts. These hormones also allow proteins, sugars, and fats from your blood supply to make breast milk in your milk-producing glands. Hormones prevent breast milk from being released before your baby is born as well as prompt milk flow after birth. Once breastfeeding has begun, thoughts of your baby, as well as his or her sucking or crying, can stimulate the release of milk from your milk-producing glands.  BENEFITS OF BREASTFEEDING For Your Baby  Your first milk (colostrum) helps your baby's digestive system function better.   There are antibodies in your milk that help your baby fight off infections.   Your baby has a lower incidence of asthma, allergies, and sudden infant death syndrome.   The nutrients in breast milk are better for your baby than infant formulas and are designed uniquely for your baby's needs.   Breast milk improves your baby's brain development.   Your baby is less likely to develop other conditions, such as childhood obesity, asthma, or type 2 diabetes mellitus.  For You   Breastfeeding helps to create a very special bond between you and your baby.   Breastfeeding is convenient. Breast milk is always available at the correct temperature and costs nothing.   Breastfeeding helps to burn calories and helps you lose the weight gained during pregnancy.   Breastfeeding makes your uterus contract to its prepregnancy size faster and slows bleeding (lochia) after you give birth.   Breastfeeding helps to lower your risk of developing type 2 diabetes mellitus, osteoporosis, and breast or ovarian cancer later in life. SIGNS THAT YOUR BABY IS HUNGRY Early Signs of Hunger  Increased alertness or activity.  Stretching.  Movement of the head from  side to side.  Movement of the head and opening of the mouth when the corner of the mouth or cheek is stroked (rooting).  Increased sucking sounds, smacking lips, cooing, sighing, or squeaking.  Hand-to-mouth movements.  Increased sucking of fingers or hands. Late Signs of Hunger  Fussing.  Intermittent crying. Extreme Signs of Hunger Signs of extreme hunger will require calming and consoling before your baby will be able to breastfeed successfully. Do not wait for the following signs of extreme hunger to occur before you initiate breastfeeding:   Restlessness.  A loud, strong cry.   Screaming. BREASTFEEDING BASICS Breastfeeding Initiation  Find a comfortable place to sit or lie down, with your neck and back well supported.  Place a pillow or rolled up blanket under your baby to bring him or her to the level of your breast (if you are seated). Nursing pillows are specially designed to help support your arms and your baby while you breastfeed.  Make sure that your baby's abdomen is facing your abdomen.   Gently massage your breast. With your fingertips, massage from your chest wall toward your nipple in a circular motion. This encourages milk flow. You may need to continue this action during the feeding if your milk flows slowly.  Support your breast with 4 fingers underneath and your thumb above your nipple. Make sure your fingers are well away from your nipple and your baby's mouth.   Stroke your baby's lips gently with your finger or nipple.   When your baby's mouth is open wide enough, quickly bring your baby to your   breast, placing your entire nipple and as much of the colored area around your nipple (areola) as possible into your baby's mouth.   More areola should be visible above your baby's upper lip than below the lower lip.   Your baby's tongue should be between his or her lower gum and your breast.   Ensure that your baby's mouth is correctly positioned  around your nipple (latched). Your baby's lips should create a seal on your breast and be turned out (everted).  It is common for your baby to suck about 2-3 minutes in order to start the flow of breast milk. Latching Teaching your baby how to latch on to your breast properly is very important. An improper latch can cause nipple pain and decreased milk supply for you and poor weight gain in your baby. Also, if your baby is not latched onto your nipple properly, he or she may swallow some air during feeding. This can make your baby fussy. Burping your baby when you switch breasts during the feeding can help to get rid of the air. However, teaching your baby to latch on properly is still the best way to prevent fussiness from swallowing air while breastfeeding. Signs that your baby has successfully latched on to your nipple:    Silent tugging or silent sucking, without causing you pain.   Swallowing heard between every 3-4 sucks.    Muscle movement above and in front of his or her ears while sucking.  Signs that your baby has not successfully latched on to nipple:   Sucking sounds or smacking sounds from your baby while breastfeeding.  Nipple pain. If you think your baby has not latched on correctly, slip your finger into the corner of your baby's mouth to break the suction and place it between your baby's gums. Attempt breastfeeding initiation again. Signs of Successful Breastfeeding Signs from your baby:   A gradual decrease in the number of sucks or complete cessation of sucking.   Falling asleep.   Relaxation of his or her body.   Retention of a small amount of milk in his or her mouth.   Letting go of your breast by himself or herself. Signs from you:  Breasts that have increased in firmness, weight, and size 1-3 hours after feeding.   Breasts that are softer immediately after breastfeeding.  Increased milk volume, as well as a change in milk consistency and color by  the fifth day of breastfeeding.   Nipples that are not sore, cracked, or bleeding. Signs That Your Baby is Getting Enough Milk  Wetting at least 3 diapers in a 24-hour period. The urine should be clear and pale yellow by age 5 days.  At least 3 stools in a 24-hour period by age 5 days. The stool should be soft and yellow.  At least 3 stools in a 24-hour period by age 7 days. The stool should be seedy and yellow.  No loss of weight greater than 10% of birth weight during the first 3 days of age.  Average weight gain of 4-7 ounces (113-198 g) per week after age 4 days.  Consistent daily weight gain by age 5 days, without weight loss after the age of 2 weeks. After a feeding, your baby may spit up a small amount. This is common. BREASTFEEDING FREQUENCY AND DURATION Frequent feeding will help you make more milk and can prevent sore nipples and breast engorgement. Breastfeed when you feel the need to reduce the fullness of your breasts   or when your baby shows signs of hunger. This is called "breastfeeding on demand." Avoid introducing a pacifier to your baby while you are working to establish breastfeeding (the first 4-6 weeks after your baby is born). After this time you may choose to use a pacifier. Research has shown that pacifier use during the first year of a baby's life decreases the risk of sudden infant death syndrome (SIDS). Allow your baby to feed on each breast as long as he or she wants. Breastfeed until your baby is finished feeding. When your baby unlatches or falls asleep while feeding from the first breast, offer the second breast. Because newborns are often sleepy in the first few weeks of life, you may need to awaken your baby to get him or her to feed. Breastfeeding times will vary from baby to baby. However, the following rules can serve as a guide to help you ensure that your baby is properly fed:  Newborns (babies 4 weeks of age or younger) may breastfeed every 1-3  hours.  Newborns should not go longer than 3 hours during the day or 5 hours during the night without breastfeeding.  You should breastfeed your baby a minimum of 8 times in a 24-hour period until you begin to introduce solid foods to your baby at around 6 months of age. BREAST MILK PUMPING Pumping and storing breast milk allows you to ensure that your baby is exclusively fed your breast milk, even at times when you are unable to breastfeed. This is especially important if you are going back to work while you are still breastfeeding or when you are not able to be present during feedings. Your lactation consultant can give you guidelines on how long it is safe to store breast milk.  A breast pump is a machine that allows you to pump milk from your breast into a sterile bottle. The pumped breast milk can then be stored in a refrigerator or freezer. Some breast pumps are operated by hand, while others use electricity. Ask your lactation consultant which type will work best for you. Breast pumps can be purchased, but some hospitals and breastfeeding support groups lease breast pumps on a monthly basis. A lactation consultant can teach you how to hand express breast milk, if you prefer not to use a pump.  CARING FOR YOUR BREASTS WHILE YOU BREASTFEED Nipples can become dry, cracked, and sore while breastfeeding. The following recommendations can help keep your breasts moisturized and healthy:  Avoid using soap on your nipples.   Wear a supportive bra. Although not required, special nursing bras and tank tops are designed to allow access to your breasts for breastfeeding without taking off your entire bra or top. Avoid wearing underwire-style bras or extremely tight bras.  Air dry your nipples for 3-4minutes after each feeding.   Use only cotton bra pads to absorb leaked breast milk. Leaking of breast milk between feedings is normal.   Use lanolin on your nipples after breastfeeding. Lanolin helps to  maintain your skin's normal moisture barrier. If you use pure lanolin, you do not need to wash it off before feeding your baby again. Pure lanolin is not toxic to your baby. You may also hand express a few drops of breast milk and gently massage that milk into your nipples and allow the milk to air dry. In the first few weeks after giving birth, some women experience extremely full breasts (engorgement). Engorgement can make your breasts feel heavy, warm, and tender to the   touch. Engorgement peaks within 3-5 days after you give birth. The following recommendations can help ease engorgement:  Completely empty your breasts while breastfeeding or pumping. You may want to start by applying warm, moist heat (in the shower or with warm water-soaked hand towels) just before feeding or pumping. This increases circulation and helps the milk flow. If your baby does not completely empty your breasts while breastfeeding, pump any extra milk after he or she is finished.  Wear a snug bra (nursing or regular) or tank top for 1-2 days to signal your body to slightly decrease milk production.  Apply ice packs to your breasts, unless this is too uncomfortable for you.  Make sure that your baby is latched on and positioned properly while breastfeeding. If engorgement persists after 48 hours of following these recommendations, contact your health care provider or a lactation consultant. OVERALL HEALTH CARE RECOMMENDATIONS WHILE BREASTFEEDING  Eat healthy foods. Alternate between meals and snacks, eating 3 of each per day. Because what you eat affects your breast milk, some of the foods may make your baby more irritable than usual. Avoid eating these foods if you are sure that they are negatively affecting your baby.  Drink milk, fruit juice, and water to satisfy your thirst (about 10 glasses a day).   Rest often, relax, and continue to take your prenatal vitamins to prevent fatigue, stress, and anemia.  Continue  breast self-awareness checks.  Avoid chewing and smoking tobacco.  Avoid alcohol and drug use. Some medicines that may be harmful to your baby can pass through breast milk. It is important to ask your health care provider before taking any medicine, including all over-the-counter and prescription medicine as well as vitamin and herbal supplements. It is possible to become pregnant while breastfeeding. If birth control is desired, ask your health care provider about options that will be safe for your baby. SEEK MEDICAL CARE IF:   You feel like you want to stop breastfeeding or have become frustrated with breastfeeding.  You have painful breasts or nipples.  Your nipples are cracked or bleeding.  Your breasts are red, tender, or warm.  You have a swollen area on either breast.  You have a fever or chills.  You have nausea or vomiting.  You have drainage other than breast milk from your nipples.  Your breasts do not become full before feedings by the fifth day after you give birth.  You feel sad and depressed.  Your baby is too sleepy to eat well.  Your baby is having trouble sleeping.   Your baby is wetting less than 3 diapers in a 24-hour period.  Your baby has less than 3 stools in a 24-hour period.  Your baby's skin or the white part of his or her eyes becomes yellow.   Your baby is not gaining weight by 5 days of age. SEEK IMMEDIATE MEDICAL CARE IF:   Your baby is overly tired (lethargic) and does not want to wake up and feed.  Your baby develops an unexplained fever. Document Released: 04/20/2005 Document Revised: 04/25/2013 Document Reviewed: 10/12/2012 Exit[employer] Patient Information 2015 [employer]. This information is not intended to replace advice given to you by your health care provider. Make sure you discuss any questions you have with your health care provider.  Preterm Labor Information Preterm labor is when labor starts at less than 37 weeks of  pregnancy. The normal length of a pregnancy is 39 to 41 weeks. CAUSES Often, there is no identifiable   underlying cause as to why a woman goes into preterm labor. One of the most common known causes of preterm labor is infection. Infections of the uterus, cervix, vagina, amniotic sac, bladder, kidney, or even the lungs (pneumonia) can cause labor to start. Other suspected causes of preterm labor include:   Urogenital infections, such as yeast infections and bacterial vaginosis.   Uterine abnormalities (uterine shape, uterine septum, fibroids, or bleeding from the placenta).   A cervix that has been operated on (it may fail to stay closed).   Malformations in the fetus.   Multiple gestations (twins, triplets, and so on).   Breakage of the amniotic sac.  RISK FACTORS  Having a previous history of preterm labor.   Having premature rupture of membranes (PROM).   Having a placenta that covers the opening of the cervix (placenta previa).   Having a placenta that separates from the uterus (placental abruption).   Having a cervix that is too weak to hold the fetus in the uterus (incompetent cervix).   Having too much fluid in the amniotic sac (polyhydramnios).   Taking illegal drugs or smoking while pregnant.   Not gaining enough weight while pregnant.   Being younger than 18 and older than 38 years old.   Having a low socioeconomic status.   Being African American. SYMPTOMS Signs and symptoms of preterm labor include:   Menstrual-like cramps, abdominal pain, or back pain.  Uterine contractions that are regular, as frequent as six in an hour, regardless of their intensity (may be mild or painful).  Contractions that start on the top of the uterus and spread down to the lower abdomen and back.   A sense of increased pelvic pressure.   A watery or bloody mucus discharge that comes from the vagina.  TREATMENT Depending on the length of the pregnancy and other  circumstances, your health care provider may suggest bed rest. If necessary, there are medicines that can be given to stop contractions and to mature the fetal lungs. If labor happens before 34 weeks of pregnancy, a prolonged hospital stay may be recommended. Treatment depends on the condition of both you and the fetus.  WHAT SHOULD YOU DO IF YOU THINK YOU ARE IN PRETERM LABOR? Call your health care provider right away. You will need to go to the hospital to get checked immediately. HOW CAN YOU PREVENT PRETERM LABOR IN FUTURE PREGNANCIES? You should:   Stop smoking if you smoke.  Maintain healthy weight gain and avoid chemicals and drugs that are not necessary.  Be watchful for any type of infection.  Inform your health care provider if you have a known history of preterm labor. Document Released: 07/11/2003 Document Revised: 12/21/2012 Document Reviewed: 05/23/2012 [employer] Patient Information 2015 [employer]. This information is not intended to replace advice given to you by your health care provider. Make sure you discuss any questions you have with your health care provider.  

## 2014-10-09 ENCOUNTER — Ambulatory Visit (INDEPENDENT_AMBULATORY_CARE_PROVIDER_SITE_OTHER): Payer: BLUE CROSS/BLUE SHIELD | Admitting: Obstetrics and Gynecology

## 2014-10-09 VITALS — BP 100/68 | HR 69 | Wt 189.0 lb

## 2014-10-09 DIAGNOSIS — Z3483 Encounter for supervision of other normal pregnancy, third trimester: Secondary | ICD-10-CM

## 2014-10-09 DIAGNOSIS — O09523 Supervision of elderly multigravida, third trimester: Secondary | ICD-10-CM

## 2014-10-09 DIAGNOSIS — O09522 Supervision of elderly multigravida, second trimester: Secondary | ICD-10-CM

## 2014-10-09 DIAGNOSIS — Z3493 Encounter for supervision of normal pregnancy, unspecified, third trimester: Secondary | ICD-10-CM

## 2014-10-09 NOTE — Progress Notes (Signed)
Subjective:   Laura Hamilton is a 39 y.o. G2P1001 at 1848w6d being seen today for her obstetrical visit.  Patient reports no complaints.  Contractions: Not present.  Vag. Bleeding: None.  Fetal Movement: Present. Denies contractions leaking of fluid.   The following portions of the patient's history were reviewed and updated as appropriate: allergies, current medications, past family history, past medical history, past social history, past surgical history and problem list.   Objective:  BP 100/68 mmHg  Pulse 69  Wt 189 lb (85.73 kg)  LMP 02/04/2014  Fetal Status: Fetal Heart Rate (bpm): 155 Fundal Height: 31 cm Movement: Present     General:  Alert, oriented and cooperative. Patient is in no acute distress.  Skin: Skin is warm and dry. No rash noted.   Abdomen: Soft, gravid, appropriate for gestational age. Pain/Pressure: Absent     Vaginal: Vag. Bleeding: None.    Vag D/C Character: Thin  Cervix: Deferred  Extremities: Normal range of motion.  Edema: None  Mental Status: Normal mood and affect. Normal behavior. Normal judgment and thought content.   Urinalysis: Urine Protein: Negative Urine Glucose: Negative   Assessment and Plan:   Pregnancy:  G2P1001 at 6848w6d  1. Supervision of normal pregnancy, third trimester Patient is without complaints.   2. Advanced maternal age in multigravida, second trimester Normal NIPSS   Labor symptoms and precations: vaginal bleeding, contractions and leaking of fluid reviewed in detail.  Fetal movement precautions also reviewed. Please refer to After Visit Summary for other counseling recommendations.   Return in about 2 weeks (around 10/23/2014).   Catalina AntiguaPeggy Kendry Pfarr, MD

## 2014-10-23 ENCOUNTER — Ambulatory Visit (INDEPENDENT_AMBULATORY_CARE_PROVIDER_SITE_OTHER): Payer: BLUE CROSS/BLUE SHIELD | Admitting: Obstetrics & Gynecology

## 2014-10-23 ENCOUNTER — Other Ambulatory Visit: Payer: Self-pay | Admitting: *Deleted

## 2014-10-23 VITALS — BP 94/62 | HR 76 | Wt 190.4 lb

## 2014-10-23 DIAGNOSIS — Z3483 Encounter for supervision of other normal pregnancy, third trimester: Secondary | ICD-10-CM

## 2014-10-23 DIAGNOSIS — O09523 Supervision of elderly multigravida, third trimester: Secondary | ICD-10-CM

## 2014-10-23 DIAGNOSIS — Z3493 Encounter for supervision of normal pregnancy, unspecified, third trimester: Secondary | ICD-10-CM

## 2014-10-23 DIAGNOSIS — F419 Anxiety disorder, unspecified: Secondary | ICD-10-CM

## 2014-10-23 MED ORDER — CLONAZEPAM 0.5 MG PO TABS: 0.5000 mg | ORAL_TABLET | Freq: Two times a day (BID) | ORAL | Status: DC | PRN

## 2014-10-23 NOTE — Patient Instructions (Signed)
Return to clinic for any obstetric concerns or go to [facility] for evaluation  

## 2014-10-23 NOTE — Progress Notes (Signed)
Subjective:  Laura Hamilton is a 39 y.o. G2P1001 at [redacted]w[redacted]d being seen today for ongoing prenatal care.  Patient reports no complaints.  Contractions: Not present.  Vag. Bleeding: None. Movement: Present. Denies leaking of fluid.   The following portions of the patient's history were reviewed and updated as appropriate: allergies, current medications, past family history, past medical history, past social history, past surgical history and problem list.   Objective:   Filed Vitals:   10/23/14 1316  BP: 94/62  Pulse: 76  Weight: 190 lb 6.4 oz (86.365 kg)   Fetal Status: Fetal Heart Rate (bpm): 162 Fundal Height: 33 cm Movement: Present     General:  Alert, oriented and cooperative. Patient is in no acute distress.  Skin: Skin is warm and dry. No rash noted.   Cardiovascular: Normal heart rate noted  Respiratory: Effort and breath sounds normal, no problems with respiration noted  Abdomen: Soft, gravid, appropriate for gestational age. Pain/Pressure: Absent     Vaginal: Vag. Bleeding: None.    Vag D/C Character: Thin  Cervix: Not evaluated       Extremities: Normal range of motion.  Edema: Trace  Mental Status: Normal mood and affect. Normal behavior. Normal judgment and thought content.   Urinalysis: Urine Protein: Negative Urine Glucose: Negative  Assessment and Plan:  Pregnancy: G2P1001 at [redacted]w[redacted]d 1. Advanced maternal age in multigravida, third trimester 2. Supervision of normal pregnancy, third trimester Term labor symptoms and general obstetric precautions including but not limited to vaginal bleeding, contractions, leaking of fluid and fetal movement were reviewed in detail with the patient. Please refer to After Visit Summary for other counseling recommendations.   Return in about 2 weeks (around 11/06/2014) for OB visit, pelvic cultures.   Tereso Newcomer, MD

## 2014-11-07 ENCOUNTER — Ambulatory Visit (INDEPENDENT_AMBULATORY_CARE_PROVIDER_SITE_OTHER): Payer: BLUE CROSS/BLUE SHIELD | Admitting: Certified Nurse Midwife

## 2014-11-07 VITALS — BP 103/67 | HR 70 | Wt 190.0 lb

## 2014-11-07 DIAGNOSIS — Z3483 Encounter for supervision of other normal pregnancy, third trimester: Secondary | ICD-10-CM

## 2014-11-07 DIAGNOSIS — Z3493 Encounter for supervision of normal pregnancy, unspecified, third trimester: Secondary | ICD-10-CM

## 2014-11-07 DIAGNOSIS — Z36 Encounter for antenatal screening of mother: Secondary | ICD-10-CM | POA: Diagnosis not present

## 2014-11-07 LAB — OB RESULTS CONSOLE GC/CHLAMYDIA
Chlamydia: NEGATIVE
Gonorrhea: NEGATIVE

## 2014-11-07 LAB — OB RESULTS CONSOLE GBS: STREP GROUP B AG: NEGATIVE

## 2014-11-07 NOTE — Patient Instructions (Signed)
Third Trimester of Pregnancy The third trimester is from week 29 through week 42, months 7 through 9. The third trimester is a time when the fetus is growing rapidly. At the end of the ninth month, the fetus is about 20 inches in length and weighs 6-10 pounds.  BODY CHANGES Your body goes through many changes during pregnancy. The changes vary from woman to woman.   Your weight will continue to increase. You can expect to gain 25-35 pounds (11-16 kg) by the end of the pregnancy.  You may begin to get stretch marks on your hips, abdomen, and breasts.  You may urinate more often because the fetus is moving lower into your pelvis and pressing on your bladder.  You may develop or continue to have heartburn as a result of your pregnancy.  You may develop constipation because certain hormones are causing the muscles that push waste through your intestines to slow down.  You may develop hemorrhoids or swollen, bulging veins (varicose veins).  You may have pelvic pain because of the weight gain and pregnancy hormones relaxing your joints between the bones in your pelvis. Backaches may result from overexertion of the muscles supporting your posture.  You may have changes in your hair. These can include thickening of your hair, rapid growth, and changes in texture. Some women also have hair loss during or after pregnancy, or hair that feels dry or thin. Your hair will most likely return to normal after your baby is born.  Your breasts will continue to grow and be tender. A yellow discharge may leak from your breasts called colostrum.  Your belly button may stick out.  You may feel short of breath because of your expanding uterus.  You may notice the fetus "dropping," or moving lower in your abdomen.  You may have a bloody mucus discharge. This usually occurs a few days to a week before labor begins.  Your cervix becomes thin and soft (effaced) near your due date. WHAT TO EXPECT AT YOUR PRENATAL  EXAMS  You will have prenatal exams every 2 weeks until week 36. Then, you will have weekly prenatal exams. During a routine prenatal visit:  You will be weighed to make sure you and the fetus are growing normally.  Your blood pressure is taken.  Your abdomen will be measured to track your baby's growth.  The fetal heartbeat will be listened to.  Any test results from the previous visit will be discussed.  You may have a cervical check near your due date to see if you have effaced. At around 36 weeks, your caregiver will check your cervix. At the same time, your caregiver will also perform a test on the secretions of the vaginal tissue. This test is to determine if a type of bacteria, Group B streptococcus, is present. Your caregiver will explain this further. Your caregiver may ask you:  What your birth plan is.  How you are feeling.  If you are feeling the baby move.  If you have had any abnormal symptoms, such as leaking fluid, bleeding, severe headaches, or abdominal cramping.  If you have any questions. Other tests or screenings that may be performed during your third trimester include:  Blood tests that check for low iron levels (anemia).  Fetal testing to check the health, activity level, and growth of the fetus. Testing is done if you have certain medical conditions or if there are problems during the pregnancy. FALSE LABOR You may feel small, irregular contractions that   eventually go away. These are called Braxton Hicks contractions, or false labor. Contractions may last for hours, days, or even weeks before true labor sets in. If contractions come at regular intervals, intensify, or become painful, it is best to be seen by your caregiver.  SIGNS OF LABOR   Menstrual-like cramps.  Contractions that are 5 minutes apart or less.  Contractions that start on the top of the uterus and spread down to the lower abdomen and back.  A sense of increased pelvic pressure or back  pain.  A watery or bloody mucus discharge that comes from the vagina. If you have any of these signs before the 37th week of pregnancy, call your caregiver right away. You need to go to the hospital to get checked immediately. HOME CARE INSTRUCTIONS   Avoid all smoking, herbs, alcohol, and unprescribed drugs. These chemicals affect the formation and growth of the baby.  Follow your caregiver's instructions regarding medicine use. There are medicines that are either safe or unsafe to take during pregnancy.  Exercise only as directed by your caregiver. Experiencing uterine cramps is a good sign to stop exercising.  Continue to eat regular, healthy meals.  Wear a good support bra for breast tenderness.  Do not use hot tubs, steam rooms, or saunas.  Wear your seat belt at all times when driving.  Avoid raw meat, uncooked cheese, cat litter boxes, and soil used by cats. These carry germs that can cause birth defects in the baby.  Take your prenatal vitamins.  Try taking a stool softener (if your caregiver approves) if you develop constipation. Eat more high-fiber foods, such as fresh vegetables or fruit and whole grains. Drink plenty of fluids to keep your urine clear or pale yellow.  Take warm sitz baths to soothe any pain or discomfort caused by hemorrhoids. Use hemorrhoid cream if your caregiver approves.  If you develop varicose veins, wear support hose. Elevate your feet for 15 minutes, 3-4 times a day. Limit salt in your diet.  Avoid heavy lifting, wear low heal shoes, and practice good posture.  Rest a lot with your legs elevated if you have leg cramps or low back pain.  Visit your dentist if you have not gone during your pregnancy. Use a soft toothbrush to brush your teeth and be gentle when you floss.  A sexual relationship may be continued unless your caregiver directs you otherwise.  Do not travel far distances unless it is absolutely necessary and only with the approval  of your caregiver.  Take prenatal classes to understand, practice, and ask questions about the labor and delivery.  Make a trial run to the hospital.  Pack your hospital bag.  Prepare the baby's nursery.  Continue to go to all your prenatal visits as directed by your caregiver. SEEK MEDICAL CARE IF:  You are unsure if you are in labor or if your water has broken.  You have dizziness.  You have mild pelvic cramps, pelvic pressure, or nagging pain in your abdominal area.  You have persistent nausea, vomiting, or diarrhea.  You have a bad smelling vaginal discharge.  You have pain with urination. SEEK IMMEDIATE MEDICAL CARE IF:   You have a fever.  You are leaking fluid from your vagina.  You have spotting or bleeding from your vagina.  You have severe abdominal cramping or pain.  You have rapid weight loss or gain.  You have shortness of breath with chest pain.  You notice sudden or extreme swelling   of your face, hands, ankles, feet, or legs.  You have not felt your baby move in over an hour.  You have severe headaches that do not go away with medicine.  You have vision changes. Document Released: 04/14/2001 Document Revised: 04/25/2013 Document Reviewed: 06/21/2012 Exit[employer] Patient Information 2015 [employer]. This information is not intended to replace advice given to you by your health care provider. Make sure you discuss any questions you have with your health care provider.  

## 2014-11-07 NOTE — Progress Notes (Signed)
gc and ch cx's obtained. GBS collected. Reports good fetal movement. Discussed labor precautions.

## 2014-11-08 LAB — GC/CHLAMYDIA PROBE AMP
CT Probe RNA: NEGATIVE
GC Probe RNA: NEGATIVE

## 2014-11-09 LAB — CULTURE, BETA STREP (GROUP B ONLY)

## 2014-11-14 ENCOUNTER — Ambulatory Visit (INDEPENDENT_AMBULATORY_CARE_PROVIDER_SITE_OTHER): Payer: BLUE CROSS/BLUE SHIELD | Admitting: Certified Nurse Midwife

## 2014-11-14 VITALS — BP 107/66 | Wt 191.0 lb

## 2014-11-14 DIAGNOSIS — Z3483 Encounter for supervision of other normal pregnancy, third trimester: Secondary | ICD-10-CM

## 2014-11-14 NOTE — Patient Instructions (Signed)

## 2014-11-14 NOTE — Progress Notes (Signed)
Subjective:  Laura Hamilton is a 39 y.o. G2P1001 at 2310w0d being seen today for ongoing prenatal care.  Patient reports occasional contractions.  Contractions: Irregular.  Vag. Bleeding: None. Movement: Present. Denies leaking of fluid.   The following portions of the patient's history were reviewed and updated as appropriate: allergies, current medications, past family history, past medical history, past social history, past surgical history and problem list. Discussed negative GBS  Objective:   Filed Vitals:   11/14/14 1329  BP: 107/66  Weight: 191 lb (86.637 kg)    Fetal Status: Fetal Heart Rate (bpm): 137   Movement: Present     General:  Alert, oriented and cooperative. Patient is in no acute distress.  Skin: Skin is warm and dry. No rash noted.   Cardiovascular: Normal heart rate noted  Respiratory: Normal respiratory effort, no problems with respiration noted  Abdomen: Soft, gravid, appropriate for gestational age. Pain/Pressure: Present     Vaginal: Vag. Bleeding: None.    Vag D/C Character: Thin  Cervix: 1/70/-2        Extremities: Normal range of motion.  Edema: Trace  Mental Status: Normal mood and affect. Normal behavior. Normal judgment and thought content.   Urinalysis: Urine Protein: Negative Urine Glucose: Negative  Assessment and Plan:  Pregnancy: G2P1001 at 4310w0d  There are no diagnoses linked to this encounter.  Term labor symptoms and general obstetric precautions including but not limited to vaginal bleeding, contractions, leaking of fluid and fetal movement were reviewed in detail with the patient.  Please refer to After Visit Summary for other counseling recommendations.   RTO 1 week  Rhea PinkLori A Hidaya Daniel, CNM

## 2014-11-20 ENCOUNTER — Ambulatory Visit (INDEPENDENT_AMBULATORY_CARE_PROVIDER_SITE_OTHER): Payer: BLUE CROSS/BLUE SHIELD | Admitting: Obstetrics & Gynecology

## 2014-11-20 VITALS — BP 116/74 | HR 80 | Wt 189.0 lb

## 2014-11-20 DIAGNOSIS — Z3483 Encounter for supervision of other normal pregnancy, third trimester: Secondary | ICD-10-CM

## 2014-11-20 DIAGNOSIS — Z3493 Encounter for supervision of normal pregnancy, unspecified, third trimester: Secondary | ICD-10-CM

## 2014-11-20 NOTE — Progress Notes (Signed)
Subjective:  Laura Hamilton is a 39 y.o. G2P1001 at 2845w6d being seen today for ongoing prenatal care.  Patient reports no complaints.  Contractions: Irregular.  Vag. Bleeding: None. Movement: Present. Denies leaking of fluid.   The following portions of the patient's history were reviewed and updated as appropriate: allergies, current medications, past family history, past medical history, past social history, past surgical history and problem list.   Objective:   Filed Vitals:   11/20/14 1104  BP: 116/74  Pulse: 80  Weight: 189 lb (85.73 kg)    Fetal Status: Fetal Heart Rate (bpm): 153   Movement: Present     General:  Alert, oriented and cooperative. Patient is in no acute distress.  Skin: Skin is warm and dry. No rash noted.   Cardiovascular: Normal heart rate noted  Respiratory: Normal respiratory effort, no problems with respiration noted  Abdomen: Soft, gravid, appropriate for gestational age. Pain/Pressure: Present     Vaginal: Vag. Bleeding: None.       Cervix: Not evaluated        Extremities: Normal range of motion.  Edema: Trace  Mental Status: Normal mood and affect. Normal behavior. Normal judgment and thought content.   Urinalysis:      Assessment and Plan:  Pregnancy: G2P1001 at 845w6d  There are no diagnoses linked to this encounter. Term labor symptoms and general obstetric precautions including but not limited to vaginal bleeding, contractions, leaking of fluid and fetal movement were reviewed in detail with the patient. Please refer to After Visit Summary for other counseling recommendations.  No Follow-up on file.   Allie BossierMyra C Mkayla Steele, MD

## 2014-11-28 ENCOUNTER — Ambulatory Visit (INDEPENDENT_AMBULATORY_CARE_PROVIDER_SITE_OTHER): Payer: BLUE CROSS/BLUE SHIELD | Admitting: Certified Nurse Midwife

## 2014-11-28 VITALS — BP 110/67 | HR 64 | Wt 192.0 lb

## 2014-11-28 DIAGNOSIS — Z3483 Encounter for supervision of other normal pregnancy, third trimester: Secondary | ICD-10-CM

## 2014-11-28 NOTE — Progress Notes (Signed)
Subjective:  Laura Hamilton is a 39 y.o. G2P1001 at [redacted]w[redacted]d being seen today for ongoing prenatal care.  Patient reports occasional contractions.  Contractions: Irregular.  Vag. Bleeding: None. Movement: Present. Denies leaking of fluid.   The following portions of the patient's history were reviewed and updated as appropriate: allergies, current medications, past family history, past medical history, past social history, past surgical history and problem list.   Objective:   Filed Vitals:   11/28/14 1325  BP: 110/67  Pulse: 64  Weight: 192 lb (87.091 kg)    Fetal Status: Fetal Heart Rate (bpm): 133   Movement: Present     General:  Alert, oriented and cooperative. Patient is in no acute distress.  Skin: Skin is warm and dry. No rash noted.   Cardiovascular: Normal heart rate noted  Respiratory: Normal respiratory effort, no problems with respiration noted  Abdomen: Soft, gravid, appropriate for gestational age. Pain/Pressure: Present     Vaginal: Vag. Bleeding: None.    Vag D/C Character: Thin  Cervix: Exam revealed        Extremities: Normal range of motion.  Edema: Trace  Mental Status: Normal mood and affect. Normal behavior. Normal judgment and thought content.   Urinalysis: Urine Protein: Negative Urine Glucose: Negative  Assessment and Plan:  Pregnancy: G2P1001 at [redacted]w[redacted]d  There are no diagnoses linked to this encounter. Term labor symptoms and general obstetric precautions including but not limited to vaginal bleeding, contractions, leaking of fluid and fetal movement were reviewed in detail with the patient. Please refer to After Visit Summary for other counseling recommendations.  No Follow-up on file.  RTO 1 week. Rhea Pink, CNM

## 2014-11-28 NOTE — Patient Instructions (Signed)
Third Trimester of Pregnancy The third trimester is from week 29 through week 42, months 7 through 9. The third trimester is a time when the fetus is growing rapidly. At the end of the ninth month, the fetus is about 20 inches in length and weighs 6-10 pounds.  BODY CHANGES Your body goes through many changes during pregnancy. The changes vary from woman to woman.   Your weight will continue to increase. You can expect to gain 25-35 pounds (11-16 kg) by the end of the pregnancy.  You may begin to get stretch marks on your hips, abdomen, and breasts.  You may urinate more often because the fetus is moving lower into your pelvis and pressing on your bladder.  You may develop or continue to have heartburn as a result of your pregnancy.  You may develop constipation because certain hormones are causing the muscles that push waste through your intestines to slow down.  You may develop hemorrhoids or swollen, bulging veins (varicose veins).  You may have pelvic pain because of the weight gain and pregnancy hormones relaxing your joints between the bones in your pelvis. Backaches may result from overexertion of the muscles supporting your posture.  You may have changes in your hair. These can include thickening of your hair, rapid growth, and changes in texture. Some women also have hair loss during or after pregnancy, or hair that feels dry or thin. Your hair will most likely return to normal after your baby is born.  Your breasts will continue to grow and be tender. A yellow discharge may leak from your breasts called colostrum.  Your belly button may stick out.  You may feel short of breath because of your expanding uterus.  You may notice the fetus "dropping," or moving lower in your abdomen.  You may have a bloody mucus discharge. This usually occurs a few days to a week before labor begins.  Your cervix becomes thin and soft (effaced) near your due date. WHAT TO EXPECT AT YOUR PRENATAL  EXAMS  You will have prenatal exams every 2 weeks until week 36. Then, you will have weekly prenatal exams. During a routine prenatal visit:  You will be weighed to make sure you and the fetus are growing normally.  Your blood pressure is taken.  Your abdomen will be measured to track your baby's growth.  The fetal heartbeat will be listened to.  Any test results from the previous visit will be discussed.  You may have a cervical check near your due date to see if you have effaced. At around 36 weeks, your caregiver will check your cervix. At the same time, your caregiver will also perform a test on the secretions of the vaginal tissue. This test is to determine if a type of bacteria, Group B streptococcus, is present. Your caregiver will explain this further. Your caregiver may ask you:  What your birth plan is.  How you are feeling.  If you are feeling the baby move.  If you have had any abnormal symptoms, such as leaking fluid, bleeding, severe headaches, or abdominal cramping.  If you have any questions. Other tests or screenings that may be performed during your third trimester include:  Blood tests that check for low iron levels (anemia).  Fetal testing to check the health, activity level, and growth of the fetus. Testing is done if you have certain medical conditions or if there are problems during the pregnancy. FALSE LABOR You may feel small, irregular contractions that   eventually go away. These are called Braxton Hicks contractions, or false labor. Contractions may last for hours, days, or even weeks before true labor sets in. If contractions come at regular intervals, intensify, or become painful, it is best to be seen by your caregiver.  SIGNS OF LABOR   Menstrual-like cramps.  Contractions that are 5 minutes apart or less.  Contractions that start on the top of the uterus and spread down to the lower abdomen and back.  A sense of increased pelvic pressure or back  pain.  A watery or bloody mucus discharge that comes from the vagina. If you have any of these signs before the 37th week of pregnancy, call your caregiver right away. You need to go to the hospital to get checked immediately. HOME CARE INSTRUCTIONS   Avoid all smoking, herbs, alcohol, and unprescribed drugs. These chemicals affect the formation and growth of the baby.  Follow your caregiver's instructions regarding medicine use. There are medicines that are either safe or unsafe to take during pregnancy.  Exercise only as directed by your caregiver. Experiencing uterine cramps is a good sign to stop exercising.  Continue to eat regular, healthy meals.  Wear a good support bra for breast tenderness.  Do not use hot tubs, steam rooms, or saunas.  Wear your seat belt at all times when driving.  Avoid raw meat, uncooked cheese, cat litter boxes, and soil used by cats. These carry germs that can cause birth defects in the baby.  Take your prenatal vitamins.  Try taking a stool softener (if your caregiver approves) if you develop constipation. Eat more high-fiber foods, such as fresh vegetables or fruit and whole grains. Drink plenty of fluids to keep your urine clear or pale yellow.  Take warm sitz baths to soothe any pain or discomfort caused by hemorrhoids. Use hemorrhoid cream if your caregiver approves.  If you develop varicose veins, wear support hose. Elevate your feet for 15 minutes, 3-4 times a day. Limit salt in your diet.  Avoid heavy lifting, wear low heal shoes, and practice good posture.  Rest a lot with your legs elevated if you have leg cramps or low back pain.  Visit your dentist if you have not gone during your pregnancy. Use a soft toothbrush to brush your teeth and be gentle when you floss.  A sexual relationship may be continued unless your caregiver directs you otherwise.  Do not travel far distances unless it is absolutely necessary and only with the approval  of your caregiver.  Take prenatal classes to understand, practice, and ask questions about the labor and delivery.  Make a trial run to the hospital.  Pack your hospital bag.  Prepare the baby's nursery.  Continue to go to all your prenatal visits as directed by your caregiver. SEEK MEDICAL CARE IF:  You are unsure if you are in labor or if your water has broken.  You have dizziness.  You have mild pelvic cramps, pelvic pressure, or nagging pain in your abdominal area.  You have persistent nausea, vomiting, or diarrhea.  You have a bad smelling vaginal discharge.  You have pain with urination. SEEK IMMEDIATE MEDICAL CARE IF:   You have a fever.  You are leaking fluid from your vagina.  You have spotting or bleeding from your vagina.  You have severe abdominal cramping or pain.  You have rapid weight loss or gain.  You have shortness of breath with chest pain.  You notice sudden or extreme swelling   of your face, hands, ankles, feet, or legs.  You have not felt your baby move in over an hour.  You have severe headaches that do not go away with medicine.  You have vision changes. Document Released: 04/14/2001 Document Revised: 04/25/2013 Document Reviewed: 06/21/2012 Exit[employer] Patient Information 2015 [employer]. This information is not intended to replace advice given to you by your health care provider. Make sure you discuss any questions you have with your health care provider.  

## 2014-11-30 ENCOUNTER — Inpatient Hospital Stay (HOSPITAL_COMMUNITY)
Admission: AD | Admit: 2014-11-30 | Discharge: 2014-12-02 | DRG: 775 | Disposition: A | Discharge status: Home / Self Care | Payer: BLUE CROSS/BLUE SHIELD | Source: Ambulatory Visit | Attending: Family Medicine | Admitting: Family Medicine

## 2014-11-30 ENCOUNTER — Encounter (HOSPITAL_COMMUNITY): Payer: Self-pay | Admitting: *Deleted

## 2014-11-30 ENCOUNTER — Inpatient Hospital Stay (HOSPITAL_COMMUNITY): Payer: BLUE CROSS/BLUE SHIELD | Admitting: Anesthesiology

## 2014-11-30 DIAGNOSIS — O4292 Full-term premature rupture of membranes, unspecified as to length of time between rupture and onset of labor: Secondary | ICD-10-CM | POA: Diagnosis not present

## 2014-11-30 DIAGNOSIS — Z87891 Personal history of nicotine dependence: Secondary | ICD-10-CM

## 2014-11-30 DIAGNOSIS — IMO0001 Reserved for inherently not codable concepts without codable children: Secondary | ICD-10-CM

## 2014-11-30 DIAGNOSIS — O09523 Supervision of elderly multigravida, third trimester: Secondary | ICD-10-CM

## 2014-11-30 DIAGNOSIS — Z3A39 39 weeks gestation of pregnancy: Secondary | ICD-10-CM | POA: Diagnosis present

## 2014-11-30 DIAGNOSIS — Z3493 Encounter for supervision of normal pregnancy, unspecified, third trimester: Secondary | ICD-10-CM

## 2014-11-30 LAB — CBC
HCT: 37.3 % (ref 36.0–46.0)
Hemoglobin: 13 g/dL (ref 12.0–15.0)
MCH: 31.5 pg (ref 26.0–34.0)
MCHC: 34.9 g/dL (ref 30.0–36.0)
MCV: 90.3 fL (ref 78.0–100.0)
Platelets: 175 10*3/uL (ref 150–400)
RBC: 4.13 MIL/uL (ref 3.87–5.11)
RDW: 13.7 % (ref 11.5–15.5)
WBC: 17.5 10*3/uL — ABNORMAL HIGH (ref 4.0–10.5)

## 2014-11-30 MED ORDER — DIPHENHYDRAMINE HCL 50 MG/ML IJ SOLN: 12.5000 mg | INTRAMUSCULAR | Status: DC | PRN

## 2014-11-30 MED ORDER — LIDOCAINE HCL (PF) 1 % IJ SOLN
30.0000 mL | INTRAMUSCULAR | Status: DC | PRN
  Filled 2014-11-30: qty 30

## 2014-11-30 MED ORDER — PHENYLEPHRINE 40 MCG/ML (10ML) SYRINGE FOR IV PUSH (FOR BLOOD PRESSURE SUPPORT)
80.0000 ug | PREFILLED_SYRINGE | INTRAVENOUS | Status: DC | PRN
  Filled 2014-11-30: qty 20
  Filled 2014-11-30: qty 2

## 2014-11-30 MED ORDER — LACTATED RINGERS IV SOLN
INTRAVENOUS | Status: DC
  Administered 2014-12-01: 01:00:00 via INTRAUTERINE

## 2014-11-30 MED ORDER — PROMETHAZINE HCL 25 MG/ML IJ SOLN: 12.5000 mg | Freq: Once | INTRAMUSCULAR | Status: DC

## 2014-11-30 MED ORDER — EPHEDRINE 5 MG/ML INJ
10.0000 mg | INTRAVENOUS | Status: DC | PRN
  Filled 2014-11-30: qty 2

## 2014-11-30 MED ORDER — OXYCODONE-ACETAMINOPHEN 5-325 MG PO TABS: 2.0000 | ORAL_TABLET | ORAL | Status: DC | PRN

## 2014-11-30 MED ORDER — OXYTOCIN BOLUS FROM INFUSION: 500.0000 mL | INTRAVENOUS | Status: DC

## 2014-11-30 MED ORDER — OXYTOCIN 40 UNITS IN LACTATED RINGERS INFUSION - SIMPLE MED
62.5000 mL/h | INTRAVENOUS | Status: DC
  Administered 2014-12-01: 62.5 mL/h via INTRAVENOUS
  Filled 2014-11-30: qty 1000

## 2014-11-30 MED ORDER — LIDOCAINE HCL (PF) 1 % IJ SOLN
INTRAMUSCULAR | Status: DC | PRN
  Administered 2014-11-30 (×2): 8 mL via EPIDURAL

## 2014-11-30 MED ORDER — ACETAMINOPHEN 325 MG PO TABS: 650.0000 mg | ORAL_TABLET | ORAL | Status: DC | PRN

## 2014-11-30 MED ORDER — CITRIC ACID-SODIUM CITRATE 334-500 MG/5ML PO SOLN: 30.0000 mL | ORAL | Status: DC | PRN

## 2014-11-30 MED ORDER — OXYCODONE-ACETAMINOPHEN 5-325 MG PO TABS: 1.0000 | ORAL_TABLET | ORAL | Status: DC | PRN

## 2014-11-30 MED ORDER — FLEET ENEMA 7-19 GM/118ML RE ENEM: 1.0000 | ENEMA | RECTAL | Status: DC | PRN

## 2014-11-30 MED ORDER — FENTANYL 2.5 MCG/ML BUPIVACAINE 1/10 % EPIDURAL INFUSION (WH - ANES)
14.0000 mL/h | INTRAMUSCULAR | Status: DC | PRN
  Administered 2014-11-30: 14 mL/h via EPIDURAL
  Filled 2014-11-30: qty 125

## 2014-11-30 MED ORDER — LACTATED RINGERS IV SOLN
INTRAVENOUS | Status: DC
  Administered 2014-11-30 (×2): via INTRAVENOUS

## 2014-11-30 MED ORDER — FENTANYL CITRATE (PF) 100 MCG/2ML IJ SOLN
50.0000 ug | INTRAMUSCULAR | Status: DC | PRN
  Administered 2014-11-30: 100 ug via INTRAVENOUS
  Filled 2014-11-30: qty 2

## 2014-11-30 MED ORDER — LACTATED RINGERS IV SOLN
500.0000 mL | INTRAVENOUS | Status: DC | PRN
  Administered 2014-11-30: 500 mL via INTRAVENOUS

## 2014-11-30 MED ORDER — ONDANSETRON HCL 4 MG/2ML IJ SOLN
4.0000 mg | Freq: Four times a day (QID) | INTRAMUSCULAR | Status: DC | PRN
  Administered 2014-11-30: 4 mg via INTRAVENOUS
  Filled 2014-11-30: qty 2

## 2014-11-30 NOTE — Anesthesia Preprocedure Evaluation (Signed)
Anesthesia Evaluation  Patient identified by MRN, date of birth, ID band Patient awake    Reviewed: Allergy & Precautions, H&P , NPO status , Patient's Chart, lab work & pertinent test results  Airway Mallampati: II  TM Distance: >3 FB Neck ROM: full    Dental no notable dental hx.    Pulmonary neg pulmonary ROS, former smoker,    Pulmonary exam normal       Cardiovascular negative cardio ROS Normal cardiovascular exam    Neuro/Psych    GI/Hepatic negative GI ROS, Neg liver ROS,   Endo/Other  negative endocrine ROS  Renal/GU negative Renal ROS     Musculoskeletal   Abdominal (+) + obese,   Peds  Hematology negative hematology ROS (+)   Anesthesia Other Findings   Reproductive/Obstetrics (+) Pregnancy                             Anesthesia Physical Anesthesia Plan  ASA: II  Anesthesia Plan: Epidural   Post-op Pain Management:    Induction:   Airway Management Planned:   Additional Equipment:   Intra-op Plan:   Post-operative Plan:   Informed Consent: I have reviewed the patients History and Physical, chart, labs and discussed the procedure including the risks, benefits and alternatives for the proposed anesthesia with the patient or authorized representative who has indicated his/her understanding and acceptance.     Plan Discussed with:   Anesthesia Plan Comments:         Anesthesia Quick Evaluation

## 2014-11-30 NOTE — Progress Notes (Signed)
Laura Hamilton is a 39 y.o. G2P1001 at [redacted]w[redacted]d admitted for rupture of membranes  Subjective: Comfortable with epidural  Objective: BP 139/68 mmHg  Pulse 88  Temp(Src) 98 F (36.7 C) (Oral)  Resp 20  Ht  (1.626 m)  Wt 87.091 kg (192 lb)  BMI 32.94 kg/m2  SpO2 96%  LMP 02/04/2014      FHT:  FHR: 150 bpm, variability: moderate,  accelerations:  Present,  decelerations:  Present prolonged variables UC:   irregular, every 2-5 minutes SVE:   Dilation: 2.5 Exam by:: Illene Bolus, CNM  Labs: Lab Results  Component Value Date   WBC 17.5* 11/30/2014   HGB 13.0 11/30/2014   HCT 37.3 11/30/2014   MCV 90.3 11/30/2014   PLT 175 11/30/2014    Assessment / Plan: Spontaneous labor, progressing normally  iupc   Labor: Progressing normally Preeclampsia:   Fetal Wellbeing:  Category II Pain Control:  Epidural I/D:   Anticipated MOD:  NSVD  Clemmons,Lori Grissett 11/30/2014, 11:07 PM

## 2014-11-30 NOTE — H&P (Signed)
Laura Hamilton is a 39 y.o.  Maternal Medical History:  Reason for admission: Rupture of membranes.   Contractions: Onset was 6-12 hours ago.   Frequency: irregular.   Perceived severity is moderate.    Fetal activity: Perceived fetal activity is normal.   Last perceived fetal movement was within the past hour.    Prenatal Complications - Diabetes: none.   Laura Hamilton 39 y.o. G2P1001 @ [redacted]w[redacted]d, Risk factors include AMA, presents to the MAU with contractions and SROM of clear Fluid. Planning Pain medicine and epidural      Clinic [facility] Prenatal Labs  Dating  Blood type: B/POS/-- (12/01 1255)   Genetic Screen 1 Screen:    AFP:     Quad:     NIPS: Antibody:NEG (12/01 1255)  Anatomic [place]  Rubella: 3.94 (12/01 1255)  GTT Early:               Third trimester:  RPR: NON REAC (05/10 1103)   Flu vaccine  HBsAg: NEGATIVE (12/01 1255)   TDaP vaccine                                               Rhogam: HIV: NONREACTIVE (05/10 1103)   Baby Food                Breastfeed                               GBS: Negative  Contraception  Pap:  Circumcision    Pediatrician    Support Person Husband     OB History    Gravida Para Term Preterm AB TAB SAB Ectopic Multiple Living   Past Medical History  Diagnosis Date  . History of acne     s/p tx with accutane  . Migraines   . PMDD (premenstrual dysphoric disorder)   . Keratosis pilaris   . Personal history of tobacco use    History reviewed. No pertinent past surgical history. Family History: family history includes Cancer in an other family member; Colitis in her mother; Colon polyps in her mother. Social History:  reports that she has quit smoking. She does not have any smokeless tobacco history on file. She reports that she does not drink alcohol or use illicit drugs.   Prenatal Transfer Tool  Maternal Diabetes: No Genetic Screening: Normal Maternal Ultrasounds/Referrals: Normal Fetal Ultrasounds or other  Referrals:  None Maternal Substance Abuse:  No Significant Maternal Medications:  None Significant Maternal Lab Results:  None Other Comments:  None  Review of Systems  Constitutional: Negative for fever.  Gastrointestinal: Positive for abdominal pain.  All other systems reviewed and are negative.   Dilation: 2 Exam by:: Lawson Fiscal Cutler Sunday Blood pressure 133/72, pulse 96, temperature 98.2 F (36.8 C), resp. rate 18, height  (1.626 m), weight 87.091 kg (192 lb), last menstrual period 02/04/2014. Maternal Exam:  Uterine Assessment: Contraction strength is moderate.  Contraction frequency is irregular.   Abdomen: Patient reports no abdominal tenderness. Fetal presentation: vertex  Introitus: Normal vulva. Normal vagina.  Ferning test: not done.  Nitrazine test: not done. Amniotic fluid character: clear.  Pelvis: adequate for delivery.   Cervix: Cervix evaluated by digital exam.  Fetal Exam Fetal Monitor Review: Mode: ultrasound.   Variability: moderate (6-25 bpm).   Pattern: accelerations present.    Fetal State Assessment: Category I - tracings are normal.     Physical Exam  Nursing note and vitals reviewed. Constitutional: She is oriented to person, place, and time. She appears well-developed and well-nourished. No distress.  HENT:  Head: Normocephalic.  Neck: Normal range of motion.  Cardiovascular: Normal rate.   Respiratory: Effort normal. No respiratory distress.  GI: Soft. She exhibits no distension.  Genitourinary: Vagina normal and uterus normal.  Musculoskeletal: Normal range of motion.  Neurological: She is alert and oriented to person, place, and time.  Skin: Skin is warm and dry.  Psychiatric: She has a normal mood and affect. Her behavior is normal. Judgment and thought content normal.    Prenatal labs: ABO, Rh: B/POS/-- (12/01 1255) Antibody: NEG (12/01 1255) Rubella: 3.94 (12/01 1255) RPR: NON REAC (05/10 1103)  HBsAg: NEGATIVE (12/01 1255)   HIV: NONREACTIVE (05/10 1103)  GBS:   Negative  Assessment/Plan: IUP @ 39+2 SROM Term labor AMA  Admit to L/D Pain medicine Epidural Anticipate Vaginal Delivery  Laura Hamilton 11/30/2014, 8:27 PM

## 2014-11-30 NOTE — MAU Note (Addendum)
Contractions about 7-36mins apart.  Has been leaking fld since 1300 at intervals. Clear fld with occ blood in fld. Contractions since yesterday and closer and stronger now. Membranes stripped 2 days ago and was 1cm at the time.

## 2014-11-30 NOTE — Anesthesia Procedure Notes (Signed)
Epidural Patient location during procedure: OB Start time: 11/30/2014 10:34 PM End time: 11/30/2014 10:38 PM  Staffing Anesthesiologist: Leilani Able  Preanesthetic Checklist Completed: patient identified, surgical consent, pre-op evaluation, timeout performed, IV checked, risks and benefits discussed and monitors and equipment checked  Epidural Patient position: sitting Prep: site prepped and draped and DuraPrep Patient monitoring: continuous pulse ox and blood pressure Approach: midline Location: L3-L4 Injection technique: LOR air  Needle:  Needle type: Tuohy  Needle gauge: 17 G Needle length: 9 cm and 9 Needle insertion depth: 6 cm Catheter type: closed end flexible Catheter size: 19 Gauge Catheter at skin depth: 11 cm Test dose: negative and Other  Assessment Sensory level: T9 Events: blood not aspirated, injection not painful, no injection resistance, negative IV test and no paresthesia  Additional Notes Reason for block:procedure for pain

## 2014-12-01 ENCOUNTER — Encounter (HOSPITAL_COMMUNITY): Payer: Self-pay | Admitting: *Deleted

## 2014-12-01 DIAGNOSIS — Z3A39 39 weeks gestation of pregnancy: Secondary | ICD-10-CM

## 2014-12-01 DIAGNOSIS — O4292 Full-term premature rupture of membranes, unspecified as to length of time between rupture and onset of labor: Secondary | ICD-10-CM

## 2014-12-01 LAB — ABO/RH: ABO/RH(D): B POS

## 2014-12-01 LAB — RPR: RPR Ser Ql: NONREACTIVE

## 2014-12-01 LAB — TYPE AND SCREEN
ABO/RH(D): B POS
Antibody Screen: NEGATIVE

## 2014-12-01 MED ORDER — OXYCODONE-ACETAMINOPHEN 5-325 MG PO TABS: 2.0000 | ORAL_TABLET | ORAL | Status: DC | PRN

## 2014-12-01 MED ORDER — DIPHENHYDRAMINE HCL 25 MG PO CAPS: 25.0000 mg | ORAL_CAPSULE | Freq: Four times a day (QID) | ORAL | Status: DC | PRN

## 2014-12-01 MED ORDER — OXYCODONE-ACETAMINOPHEN 5-325 MG PO TABS: 1.0000 | ORAL_TABLET | ORAL | Status: DC | PRN

## 2014-12-01 MED ORDER — SENNOSIDES-DOCUSATE SODIUM 8.6-50 MG PO TABS
2.0000 | ORAL_TABLET | ORAL | Status: DC
  Administered 2014-12-01: 2 via ORAL
  Filled 2014-12-01: qty 2

## 2014-12-01 MED ORDER — ONDANSETRON HCL 4 MG PO TABS: 4.0000 mg | ORAL_TABLET | ORAL | Status: DC | PRN

## 2014-12-01 MED ORDER — IBUPROFEN 600 MG PO TABS
600.0000 mg | ORAL_TABLET | Freq: Four times a day (QID) | ORAL | Status: DC
  Administered 2014-12-01 – 2014-12-02 (×5): 600 mg via ORAL
  Filled 2014-12-01 (×5): qty 1

## 2014-12-01 MED ORDER — WITCH HAZEL-GLYCERIN EX PADS
1.0000 "application " | MEDICATED_PAD | CUTANEOUS | Status: DC | PRN
  Administered 2014-12-01: 1 via TOPICAL

## 2014-12-01 MED ORDER — ZOLPIDEM TARTRATE 5 MG PO TABS: 5.0000 mg | ORAL_TABLET | Freq: Every evening | ORAL | Status: DC | PRN

## 2014-12-01 MED ORDER — DIBUCAINE 1 % RE OINT
1.0000 "application " | TOPICAL_OINTMENT | RECTAL | Status: DC | PRN
  Administered 2014-12-01: 1 via RECTAL
  Filled 2014-12-01: qty 28

## 2014-12-01 MED ORDER — ONDANSETRON HCL 4 MG/2ML IJ SOLN: 4.0000 mg | INTRAMUSCULAR | Status: DC | PRN

## 2014-12-01 MED ORDER — ACETAMINOPHEN 325 MG PO TABS: 650.0000 mg | ORAL_TABLET | ORAL | Status: DC | PRN

## 2014-12-01 MED ORDER — LANOLIN HYDROUS EX OINT: TOPICAL_OINTMENT | CUTANEOUS | Status: DC | PRN

## 2014-12-01 MED ORDER — BENZOCAINE-MENTHOL 20-0.5 % EX AERO
1.0000 "application " | INHALATION_SPRAY | CUTANEOUS | Status: DC | PRN
  Administered 2014-12-01: 1 via TOPICAL
  Filled 2014-12-01: qty 56

## 2014-12-01 MED ORDER — PRENATAL MULTIVITAMIN CH
1.0000 | ORAL_TABLET | Freq: Every day | ORAL | Status: DC
  Administered 2014-12-01 – 2014-12-02 (×2): 1 via ORAL
  Filled 2014-12-01 (×2): qty 1

## 2014-12-01 MED ORDER — TETANUS-DIPHTH-ACELL PERTUSSIS 5-2.5-18.5 LF-MCG/0.5 IM SUSP: 0.5000 mL | Freq: Once | INTRAMUSCULAR | Status: DC

## 2014-12-01 MED ORDER — SIMETHICONE 80 MG PO CHEW: 80.0000 mg | CHEWABLE_TABLET | ORAL | Status: DC | PRN

## 2014-12-02 LAB — HIV ANTIBODY (ROUTINE TESTING W REFLEX): HIV Screen 4th Generation wRfx: NONREACTIVE

## 2014-12-02 MED ORDER — IBUPROFEN 600 MG PO TABS: 600.0000 mg | ORAL_TABLET | Freq: Four times a day (QID) | ORAL | Status: DC | PRN

## 2014-12-02 NOTE — Anesthesia Postprocedure Evaluation (Signed)
  Anesthesia Post-op Note  Patient: Laura Hamilton  Procedure(s) Performed: * No procedures listed *  Patient Location: PACU and Mother/Baby  Anesthesia Type:Epidural  Level of Consciousness: awake, alert  and oriented  Airway and Oxygen Therapy: Patient Spontanous Breathing  Post-op Pain: none  Post-op Assessment: Post-op Vital signs reviewed, Patient's Cardiovascular Status Stable, No headache, No backache, No residual numbness and No residual motor weakness  Post-op Vital Signs: Reviewed and stable  Complications: No apparent anesthesia complications

## 2014-12-02 NOTE — Discharge Summary (Signed)
Obstetric Discharge Summary Reason for Admission: rupture of membranes Prenatal Procedures: ultrasound Intrapartum Procedures: spontaneous vaginal delivery Postpartum Procedures: none Complications-Operative and Postpartum: 2nd degree perineal laceration Eating, drinking, voiding, ambulating well.  +flatus.  Lochia and pain wnl.  Denies dizziness, lightheadedness, or sob. No complaints.   Hospital Course: Laura Hamilton is a 39 y.o. G53P2002 female admited at 39.2wks w/ SROM. She progressed normally to uncomplicated SVB. Her pp course has been uncomplicated.  By PPD#1 she is doing well and is deemed to have received the full benefit of her hospital stay.  Filed Vitals:   12/02/14 0551  BP: 102/72  Pulse: 62  Temp: 97.9 F (36.6 C)  Resp: 18   H/H: Lab Results  Component Value Date/Time   HGB 13.0 11/30/2014 09:55 PM   HCT 37.3 11/30/2014 09:55 PM    Physical Exam: General: alert, cooperative and no distress Abdomen/Uterine Fundus: Appropriately tender, non-distended, FF @ U-2 Incision: n/a Lochia: appropriate Extremities: No evidence of DVT seen on physical exam. Negative Homan's sign, no cords, calf tenderness, or significant calf/ankle edema   Discharge Diagnoses: Term Pregnancy-delivered  Discharge Information: Date: 11/13/2010 Activity: pelvic rest Diet: routine  Medications: PNV and Ibuprofen Breast feeding: Yes Contraception: vasectomy Circumcision: prior to d/c Condition: stable Instructions: refer to handout Discharge to: home  Infant: Home with mother  Follow-up Information    Schedule an appointment as soon as possible for a visit with [facility] [employer] [facility].   Specialty:  Obstetrics and Gynecology   Why:  4-6 weeks for your postpartum visit   Contact information:   [address] [place] [address] [phone]      Marge Duncans, CNM, WHNP-BC 12/02/2014,8:50 AM

## 2014-12-02 NOTE — Discharge Instructions (Signed)
Be cautious with Klonopin and Prozac while breastfeeding, it is best to take immediately after nursing, please let the pediatrician know you are taking these  Postpartum Care After Vaginal Delivery After you deliver your newborn (postpartum period), the usual stay in the hospital is 24-72 hours. If there were problems with your labor or delivery, or if you have other medical problems, you might be in the hospital longer.  While you are in the hospital, you will receive help and instructions on how to care for yourself and your newborn during the postpartum period.  While you are in the hospital:  Be sure to tell your nurses if you have pain or discomfort, as well as where you feel the pain and what makes the pain worse.  If you had an incision made near your vagina (episiotomy) or if you had some tearing during delivery, the nurses may put ice packs on your episiotomy or tear. The ice packs may help to reduce the pain and swelling.  If you are breastfeeding, you may feel uncomfortable contractions of your uterus for a couple of weeks. This is normal. The contractions help your uterus get back to normal size.  It is normal to have some bleeding after delivery.  For the first 1-3 days after delivery, the flow is red and the amount may be similar to a period.  It is common for the flow to start and stop.  In the first few days, you may pass some small clots. Let your nurses know if you begin to pass large clots or your flow increases.  Do not  flush blood clots down the toilet before having the nurse look at them.  During the next 3-10 days after delivery, your flow should become more watery and pink or brown-tinged in color.  Ten to fourteen days after delivery, your flow should be a small amount of yellowish-white discharge.  The amount of your flow will decrease over the first few weeks after delivery. Your flow may stop in 6-8 weeks. Most women have had their flow stop by 12 weeks after  delivery.  You should change your sanitary pads frequently.  Wash your hands thoroughly with soap and water for at least 20 seconds after changing pads, using the toilet, or before holding or feeding your newborn.  You should feel like you need to empty your bladder within the first 6-8 hours after delivery.  In case you become weak, lightheaded, or faint, call your nurse before you get out of bed for the first time and before you take a shower for the first time.  Within the first few days after delivery, your breasts may begin to feel tender and full. This is called engorgement. Breast tenderness usually goes away within 48-72 hours after engorgement occurs. You may also notice milk leaking from your breasts. If you are not breastfeeding, do not stimulate your breasts. Breast stimulation can make your breasts produce more milk.  Spending as much time as possible with your newborn is very important. During this time, you and your newborn can feel close and get to know each other. Having your newborn stay in your room (rooming in) will help to strengthen the bond with your newborn. It will give you time to get to know your newborn and become comfortable caring for your newborn.  Your hormones change after delivery. Sometimes the hormone changes can temporarily cause you to feel sad or tearful. These feelings should not last more than a few days. If  these feelings last longer than that, you should talk to your caregiver.  If desired, talk to your caregiver about methods of family planning or contraception.  Talk to your caregiver about immunizations. Your caregiver may want you to have the following immunizations before leaving the hospital:  Tetanus, diphtheria, and pertussis (Tdap) or tetanus and diphtheria (Td) immunization. It is very important that you and your family (including grandparents) or others caring for your newborn are up-to-date with the Tdap or Td immunizations. The Tdap or Td  immunization can help protect your newborn from getting ill.  Rubella immunization.  Varicella (chickenpox) immunization.  Influenza immunization. You should receive this annual immunization if you did not receive the immunization during your pregnancy. Document Released: 02/15/2007 Document Revised: 01/13/2012 Document Reviewed: 12/16/2011 [facility] Patient Information 2015 [place], [place]. This information is not intended to replace advice given to you by your health care provider. Make sure you discuss any questions you have with your health care provider.

## 2014-12-07 ENCOUNTER — Encounter: Payer: BLUE CROSS/BLUE SHIELD | Admitting: Obstetrics & Gynecology

## 2014-12-11 ENCOUNTER — Other Ambulatory Visit: Payer: Self-pay

## 2014-12-11 DIAGNOSIS — F419 Anxiety disorder, unspecified: Secondary | ICD-10-CM

## 2014-12-11 MED ORDER — FLUOXETINE HCL 40 MG PO CAPS: 40.0000 mg | ORAL_CAPSULE | Freq: Every day | ORAL | Status: DC

## 2014-12-11 MED ORDER — CLONAZEPAM 0.5 MG PO TABS: 0.5000 mg | ORAL_TABLET | Freq: Two times a day (BID) | ORAL | Status: DC | PRN

## 2014-12-11 NOTE — Telephone Encounter (Signed)
Pt left v/m; pt recently had baby; pt had been followed at [facility]; Dr Milinda Antis had taken pt off clonazepam and fluoxetine. [facility] put pt back on clonazepam and fluoxetine. Pt wants to know if pt should be seen prior to getting med refilled again by Dr Milinda Antis. Womens put pt on fluoxetine 20 mg instead of 40 mg and pt thinks needs to restart the 40 mg. Pt request cb. Pt last seen by Dr Milinda Antis on 03/27/14.

## 2014-12-11 NOTE — Telephone Encounter (Signed)
Please verify that she is not breast feeding before calling medicines in  Please let me know how she is doing - congratulations

## 2014-12-12 NOTE — Telephone Encounter (Signed)
Left voicemail requesting pt to call office back 

## 2014-12-12 NOTE — Telephone Encounter (Signed)
Husband called back and I advise him of Dr. Royden Purl comments. Pt isn't breast feeding so Rxs called into pharmacy as prescribed Husband said baby and pt are doing well, pt was sleeping so he didn't want to wake her up to return our call

## 2014-12-20 ENCOUNTER — Other Ambulatory Visit: Payer: Self-pay | Admitting: *Deleted

## 2014-12-24 ENCOUNTER — Other Ambulatory Visit: Payer: Self-pay | Admitting: *Deleted

## 2014-12-24 NOTE — Telephone Encounter (Signed)
Informed pts husband RX refill is denied, advised him to follow up with her OB provider, he states they called that office and were told to call the pharmacy for refill.  I advised him to call their Dr's office back and explain to them that the pharmacy is sending [place] the refill request since Selena Batten is the one that RX'd the medication at the hospital.  Pts husband verbalized understanding.

## 2015-01-11 ENCOUNTER — Telehealth: Payer: Self-pay

## 2015-01-11 DIAGNOSIS — F419 Anxiety disorder, unspecified: Secondary | ICD-10-CM

## 2015-01-11 MED ORDER — FLUOXETINE HCL 20 MG PO TABS: 60.0000 mg | ORAL_TABLET | Freq: Every day | ORAL | Status: DC

## 2015-01-11 MED ORDER — CLONAZEPAM 0.5 MG PO TABS: 1.0000 mg | ORAL_TABLET | Freq: Two times a day (BID) | ORAL | Status: DC | PRN

## 2015-01-11 NOTE — Telephone Encounter (Signed)
Pt left v/m on med list clonazepam 0.5 mg instructions 1 tab bid prn anxiety; since pt had baby that is having colic pt has been taking clonazepam 0.5 mg 2 tabs bid. Pt request new rx of clonazepam 1 mg to take bid. Pt request cb. [facility].

## 2015-01-11 NOTE — Telephone Encounter (Signed)
I will inc prozac to 60 mg  Update if any problems with that (not expected)  Please call it in  Since the 40 are caps she has to change to 20 mg tabs

## 2015-01-11 NOTE — Telephone Encounter (Signed)
Rx called in as prescribed  Pt said she isn't breast feeding. I advise pt of Dr. Royden Purl comments/recommendations and she verbalized understanding.  Pt said she doesn't feel like she is suffering from PPD (no suicidal thoughts, no thoughts of harming baby, no long tearful episodes) she just feels overwhelmed because baby has colic and her husband is back at work so she isn't getting much sleep and her anxiety is starting to get worse. Pt does want Dr. Milinda Antis to increase her Prozac if she thinks it will help. Pt said she will try to limit the amount of Rx she takes but at the end of this month if she is still taking the max dose she will update [place] and if need be she can come in for an appt with Dr. Milinda Antis, she was just trying to avoid that for now due to her high co-pay.

## 2015-01-11 NOTE — Telephone Encounter (Signed)
Rx sent to pharmacy and pt notified of Dr. Tower's comments  

## 2015-01-11 NOTE — Telephone Encounter (Signed)
Please make sure she is not breast feeding This is habit forming and can cause withdrawal symptoms when coming down or off of dose so please only take this much if she absolutely needs it  We can do it short term- 1 month max  ? Any s/s of post partum depression? (this can be a really tough time especially with colic) We can increase her prozac dose if needed   Px written for call in

## 2015-01-11 NOTE — Telephone Encounter (Signed)
Left voicemail requesting pt to call office back 

## 2015-01-16 ENCOUNTER — Encounter: Payer: Self-pay | Admitting: Certified Nurse Midwife

## 2015-01-16 ENCOUNTER — Ambulatory Visit (INDEPENDENT_AMBULATORY_CARE_PROVIDER_SITE_OTHER): Payer: BLUE CROSS/BLUE SHIELD | Admitting: Certified Nurse Midwife

## 2015-01-16 VITALS — BP 102/69 | HR 70 | Resp 18 | Wt 169.0 lb

## 2015-01-16 DIAGNOSIS — F53 Puerperal psychosis: Secondary | ICD-10-CM

## 2015-01-16 DIAGNOSIS — Z30430 Encounter for insertion of intrauterine contraceptive device: Secondary | ICD-10-CM | POA: Diagnosis not present

## 2015-01-16 DIAGNOSIS — Z01812 Encounter for preprocedural laboratory examination: Secondary | ICD-10-CM | POA: Diagnosis not present

## 2015-01-16 LAB — POCT URINE PREGNANCY: Preg Test, Ur: NEGATIVE

## 2015-01-16 NOTE — Progress Notes (Signed)
Subjective:     Laura Hamilton is a 39 y.o. female who presents for a postpartum visit. She is 6 weeks postpartum following a spontaneous vaginal delivery. I have fully reviewed the prenatal and intrapartum course. The delivery was at 39+ gestational weeks. Outcome: spontaneous vaginal delivery. Anesthesia: epidural. Postpartum course has been had some depression. Increased to  Prozac. No suicidal ideations or harming the baby. Having episodes of crying.. Baby's course has been uneventful. Baby is feeding by bottle - Similac Alimentum. Bleeding no bleeding. Bowel function is normal. Bladder function is normal. Patient is not sexually active. Contraception method is IUD. Postpartum depression screening: 11.  The following portions of the patient's history were reviewed and updated as appropriate: allergies, current medications, past family history, past medical history, past social history, past surgical history and problem list.  Review of Systems Behavioral/Psych: positive for depression   Objective:    BP 102/69 mmHg  Pulse 70  Resp 18  Wt 169 lb (76.658 kg)  General:  alert and cooperative   Breasts:  inspection negative, no nipple discharge or bleeding, no masses or nodularity palpable  Lungs: clear to auscultation bilaterally  Heart:  regular rate and rhythm, S1, S2 normal, no murmur, click, rub or gallop  Abdomen: abnormal findings:  mild tenderness in the RLQ   Vulva:  normal  Vagina: normal vagina  Cervix:  wnl  Corpus: normal  Adnexa:  normal adnexa  Rectal Exam: Not performed.        Assessment:  Patient identified, informed consent performed, signed copy in chart, time out was performed.  Urine pregnancy test negative.  Speculum placed in the vagina.  Cervix visualized.  Cleaned with Betadine x 2.  Grasped anteriourly with a single tooth tenaculum.  Uterus sounded to 6cm.  Mirena IUD placed per manufacturer's recommendations.  Strings trimmed to 3 cm.   Patient given  post procedure instructions and Mirena care card with expiration date.  Patient is asked to check IUD strings periodically and follow up in 4-6 weeks for IUD check.    6 week postpartum exam. Pap smear not done at today's visit.   Plan:    1. Contraception: IUD 2. IUD Insertion 3. Follow up in: 4 weeks or as needed.

## 2015-01-17 ENCOUNTER — Encounter: Payer: Self-pay | Admitting: *Deleted

## 2015-02-13 ENCOUNTER — Ambulatory Visit (INDEPENDENT_AMBULATORY_CARE_PROVIDER_SITE_OTHER): Payer: BLUE CROSS/BLUE SHIELD | Admitting: Certified Nurse Midwife

## 2015-02-13 ENCOUNTER — Encounter: Payer: Self-pay | Admitting: Certified Nurse Midwife

## 2015-02-13 VITALS — BP 126/78 | HR 80 | Wt 168.0 lb

## 2015-02-13 DIAGNOSIS — Z30431 Encounter for routine checking of intrauterine contraceptive device: Secondary | ICD-10-CM | POA: Diagnosis not present

## 2015-02-13 NOTE — Progress Notes (Signed)
     GYNECOLOGY CLINIC PROGRESS NOTE  History:  39 y.o. [id number] here today for today for IUD string check; mirena IUD was placed  4 weeks ago. No complaints about the IUD, no concerning side effects.  The following portions of the patient's history were reviewed and updated as appropriate: allergies, current medications, past family history, past medical history, past social history, past surgical history and problem list.   Review of Systems:  Pertinent items are noted in HPI.  Objective:  Physical Exam Blood pressure 126/78, pulse 80, weight 168 lb (76.204 kg), last menstrual period 01/23/2015, not currently breastfeeding. CONSTITUTIONAL: Well-developed, well-nourished female in no acute distress.  HENT:  Normocephalic, atraumatic. External right and left ear normal. Oropharynx is clear and moist EYES: Conjunctivae and EOM are normal. Pupils are equal, round, and reactive to light. No scleral icterus.  NECK: Normal range of motion, supple, no masses CARDIOVASCULAR: Normal heart rate noted RESPIRATORY: Effort and breath sounds normal, no problems with respiration noted ABDOMEN: Soft, no distention noted.   PELVIC: Normal appearing external genitalia; normal appearing vaginal mucosa and cervix.  IUD strings visualized, about 2 cm in length outside cervix.   Assessment & Plan:  Normal IUD check. Patient to keep IUD in place for five years; can come in for removal if she desires pregnancy within the next five years. Routine preventative health maintenance measures emphasized.   Illene BolusLori Hayle Parisi CNM [facility]

## 2015-02-13 NOTE — Patient Instructions (Signed)
Levonorgestrel intrauterine device (IUD) What is this medicine? LEVONORGESTREL IUD (LEE voe nor jes trel) is a contraceptive (birth control) device. The device is placed inside the uterus by a healthcare professional. It is used to prevent pregnancy and can also be used to treat heavy bleeding that occurs during your period. Depending on the device, it can be used for 3 to 5 years. This medicine may be used for other purposes; ask your health care provider or pharmacist if you have questions. What should I tell my health care provider before I take this medicine? They need to know if you have any of these conditions: -abnormal Pap smear -cancer of the breast, uterus, or cervix -diabetes -endometritis -genital or pelvic infection now or in the past -have more than one sexual partner or your partner has more than one partner -heart disease -history of an ectopic or tubal pregnancy -immune system problems -IUD in place -liver disease or tumor -problems with blood clots or take blood-thinners -use intravenous drugs -uterus of unusual shape -vaginal bleeding that has not been explained -an unusual or allergic reaction to levonorgestrel, other hormones, silicone, or polyethylene, medicines, foods, dyes, or preservatives -pregnant or trying to get pregnant -breast-feeding How should I use this medicine? This device is placed inside the uterus by a health care professional. Talk to your pediatrician regarding the use of this medicine in children. Special care may be needed. Overdosage: If you think you have taken too much of this medicine contact a poison control center or emergency room at once. NOTE: This medicine is only for you. Do not share this medicine with others. What if I miss a dose? This does not apply. What may interact with this medicine? Do not take this medicine with any of the following medications: -amprenavir -bosentan -fosamprenavir This medicine may also interact with  the following medications: -aprepitant -barbiturate medicines for inducing sleep or treating seizures -bexarotene -griseofulvin -medicines to treat seizures like carbamazepine, ethotoin, felbamate, oxcarbazepine, phenytoin, topiramate -modafinil -pioglitazone -rifabutin -rifampin -rifapentine -some medicines to treat HIV infection like atazanavir, indinavir, lopinavir, nelfinavir, tipranavir, ritonavir -St. John's wort -warfarin This list may not describe all possible interactions. Give your health care provider a list of all the medicines, herbs, non-prescription drugs, or dietary supplements you use. Also tell them if you smoke, drink alcohol, or use illegal drugs. Some items may interact with your medicine. What should I watch for while using this medicine? Visit your doctor or health care professional for regular check ups. See your doctor if you or your partner has sexual contact with others, becomes HIV positive, or gets a sexual transmitted disease. This product does not protect you against HIV infection (AIDS) or other sexually transmitted diseases. You can check the placement of the IUD yourself by reaching up to the top of your vagina with clean fingers to feel the threads. Do not pull on the threads. It is a good habit to check placement after each menstrual period. Call your doctor right away if you feel more of the IUD than just the threads or if you cannot feel the threads at all. The IUD may come out by itself. You may become pregnant if the device comes out. If you notice that the IUD has come out use a backup birth control method like condoms and call your health care provider. Using tampons will not change the position of the IUD and are okay to use during your period. What side effects may I notice from receiving this medicine?   Side effects that you should report to your doctor or health care professional as soon as possible: -allergic reactions like skin rash, itching or  hives, swelling of the face, lips, or tongue -fever, flu-like symptoms -genital sores -high blood pressure -no menstrual period for 6 weeks during use -pain, swelling, warmth in the leg -pelvic pain or tenderness -severe or sudden headache -signs of pregnancy -stomach cramping -sudden shortness of breath -trouble with balance, talking, or walking -unusual vaginal bleeding, discharge -yellowing of the eyes or skin Side effects that usually do not require medical attention (report to your doctor or health care professional if they continue or are bothersome): -acne -breast pain -change in sex drive or performance -changes in weight -cramping, dizziness, or faintness while the device is being inserted -headache -irregular menstrual bleeding within first 3 to 6 months of use -nausea This list may not describe all possible side effects. Call your doctor for medical advice about side effects. You may report side effects to FDA at 1-800-FDA-1088. Where should I keep my medicine? This does not apply. NOTE: This sheet is a summary. It may not cover all possible information. If you have questions about this medicine, talk to your doctor, pharmacist, or health care provider.    2016, [employer]. (2011-05-21 13:54:04)  

## 2015-04-05 ENCOUNTER — Other Ambulatory Visit: Payer: Self-pay | Admitting: Family Medicine

## 2015-04-05 NOTE — Telephone Encounter (Signed)
Pt had CPE 07/18/14, last refilled on 01/11/15 #120 tabs with 0 refills, please advise

## 2015-04-05 NOTE — Telephone Encounter (Signed)
Px written for call in   

## 2015-04-05 NOTE — Telephone Encounter (Signed)
Rx called in as prescribed 

## 2015-05-07 ENCOUNTER — Other Ambulatory Visit: Payer: Self-pay | Admitting: Family Medicine

## 2015-05-07 NOTE — Telephone Encounter (Signed)
Instructions are 1 pill bid prn  She got 120 pills on 12/2 - so it is too early  Please ask how many she needs in a day and let me know Also schedule a f/u

## 2015-05-07 NOTE — Telephone Encounter (Signed)
Last OV was 07/20/14, last refilled on 04/05/15 #120 with 0 refills, please advise

## 2015-05-08 NOTE — Telephone Encounter (Signed)
Left voicemail requesting pt to call office back 

## 2015-05-08 NOTE — Telephone Encounter (Signed)
Please call this in to get her by until her appt Thanks

## 2015-05-08 NOTE — Telephone Encounter (Signed)
appt scheduled for 05/13/15, pt said she talked to Dr. Milinda Antisower and Dr. Milinda Antisower advise her to increase her med so pt said she has been taking 1.5mg - 2mg  of med up to BID, pt said she is out of med (she has taken all 120 tabs)

## 2015-05-09 ENCOUNTER — Other Ambulatory Visit: Payer: Self-pay | Admitting: Family Medicine

## 2015-05-09 NOTE — Telephone Encounter (Signed)
Pt RX called in as prescribed, pt was notified./tvw

## 2015-05-13 ENCOUNTER — Encounter: Payer: Self-pay | Admitting: Family Medicine

## 2015-05-13 ENCOUNTER — Ambulatory Visit (INDEPENDENT_AMBULATORY_CARE_PROVIDER_SITE_OTHER): Payer: BLUE CROSS/BLUE SHIELD | Admitting: Family Medicine

## 2015-05-13 VITALS — BP 106/64 | HR 58 | Temp 98.1°F | Ht 64.0 in | Wt 167.5 lb

## 2015-05-13 DIAGNOSIS — F419 Anxiety disorder, unspecified: Secondary | ICD-10-CM

## 2015-05-13 DIAGNOSIS — Z23 Encounter for immunization: Secondary | ICD-10-CM | POA: Diagnosis not present

## 2015-05-13 MED ORDER — CLONAZEPAM 0.5 MG PO TABS: 0.5000 mg | ORAL_TABLET | Freq: Two times a day (BID) | ORAL | Status: DC | PRN

## 2015-05-13 MED ORDER — PAROXETINE HCL 30 MG PO TABS: 60.0000 mg | ORAL_TABLET | Freq: Every day | ORAL | Status: DC

## 2015-05-13 NOTE — Patient Instructions (Addendum)
Stop prozac and start the generic paxil 2 pills once daily in the evening  If any intolerable side effects please update me  Goal is to help the anxiety more and make you less dependent on klonopin  When you start feeling less anxious- begin to cut back on klonopin gradually - at first cut from 1 mg to .5 twice and then cut it more if you can  Follow up with me in about 4-6 weeks  Update me if any issues in the meantime  Do the couples therapy and inquire about individual therapy while you are there Keep walking Drink your water  Get out whenever you can as well

## 2015-05-13 NOTE — Progress Notes (Signed)
Pre visit review using our clinic review tool, if applicable. No additional management support is needed unless otherwise documented below in the visit note. 

## 2015-05-13 NOTE — Assessment & Plan Note (Addendum)
In pt with hx of recent PP depression  Reviewed stressors/ coping techniques/symptoms/ support sources/ tx options and side effects in detail today  Many stressors Enc to start counseling (couples), as well as individual Goal -to reduce klonopin which she takes avg 1 mg bid  Will change her prozac to paxil 60 mg to see if more eff for anxiety Discussed expectations of SSRI medication including time to effectiveness and mechanism of action, also poss of side effects (early and late)- including mental fuzziness, weight or appetite change, nausea and poss of worse dep or anxiety (even suicidal thoughts)  Pt voiced understanding and will stop med and update if this occurs   Rev imp of self care >25 minutes spent in face to face time with patient, >50% spent in counselling or coordination of care  F/u 4-6 weeks  Will try to gradually reduce klonopin as able -refilled today-will fill px when done with short amt given last time

## 2015-05-13 NOTE — Progress Notes (Signed)
Subjective:    Patient ID: Laura Hamilton, female    DOB: 12-Feb-1976, 40 y.o.   MRN: [id number]  HPI Here for f/u of anxiety   Gave birth to 2nd child in summer  Had a sick pregnancy - with nausea the whole time   Had to go on klonopin with pregnancy and stayed on 20 of prozac   Not breastfeeding  Went back up to 60 of prozac   Had some post partum depression -that is improving Anxiety is worse however -esp over the holidays   Found herself needing 2 pills of the .5 twice daily to get though the day  Avoiding going places  Feels good on this   A lot of social phobia  Is going to start going to couples counseling with husband - will start 6 free sessions with husb work-had to re schedule first session-is upcoming  She will also ask about individual counseling  Also trying to take care of her mother in [place] - she requires more care  Daughter is going to college  Her plate is full Is however-not working - can get by for now without it  No family here - not a lot of support   Husband is good support -her works as a Company secretary and also a part time job  She is home a lot - that is part of the problem (she does not get out much)  No time to take care of herself - re: water intake and meals and exercise  Baby has colic as well -just started to improve   Patient Active Problem List   Diagnosis Date Noted  . Active labor at term 11/30/2014  . Supervision of normal pregnancy 04/03/2014  . Advanced maternal age in multigravida 04/03/2014  . Low back pain 12/26/2012  . Hyperlipidemia 08/26/2010  . Anxiety 08/26/2010  . History of acne   . Keratosis pilaris   . Personal history of tobacco use   . TMJ PAIN 03/13/2009  . PREMENSTRUAL DYSPHORIC SYNDROME 07/25/2008  . MENORRHAGIA 07/14/2007  . MIGRAINES, HX OF 10/08/2006   Past Medical History  Diagnosis Date  . History of acne     s/p tx with accutane  . Migraines   . PMDD (premenstrual dysphoric disorder)   . Keratosis pilaris     . Personal history of tobacco use    Past Surgical History  Procedure Laterality Date  . Wisdom tooth extraction     Social History  Substance Use Topics  . Smoking status: Former Games developer  . Smokeless tobacco: None  . Alcohol Use: No     Comment: Rarely   Family History  Problem Relation Age of Onset  . Colon polyps Mother   . Colitis Mother   . Cancer      aunt-lymphoma   Allergies  Allergen Reactions  . Other Nausea Only    ALL NARCOTICS   Current Outpatient Prescriptions on File Prior to Visit  Medication Sig Dispense Refill  . clonazePAM (KLONOPIN) 0.5 MG tablet TAKE 1 TABLET BY MOUTH TWICE A DAY AS NEEDED FOR ANXIETY 40 tablet 0  . FLUoxetine (PROZAC) 20 MG tablet Take 3 tablets (60 mg total) by mouth daily. 90 tablet 11   No current facility-administered medications on file prior to visit.      Has not been on other ssri beside prozac   Had a flu shot today   Patient Active Problem List   Diagnosis Date Noted  . Active labor at term  11/30/2014  . Supervision of normal pregnancy 04/03/2014  . Advanced maternal age in multigravida 04/03/2014  . Low back pain 12/26/2012  . Hyperlipidemia 08/26/2010  . Anxiety 08/26/2010  . History of acne   . Keratosis pilaris   . Personal history of tobacco use   . TMJ PAIN 03/13/2009  . PREMENSTRUAL DYSPHORIC SYNDROME 07/25/2008  . MENORRHAGIA 07/14/2007  . MIGRAINES, HX OF 10/08/2006   Past Medical History  Diagnosis Date  . History of acne     s/p tx with accutane  . Migraines   . PMDD (premenstrual dysphoric disorder)   . Keratosis pilaris   . Personal history of tobacco use    Past Surgical History  Procedure Laterality Date  . Wisdom tooth extraction     Social History  Substance Use Topics  . Smoking status: Former Games developer  . Smokeless tobacco: None  . Alcohol Use: No     Comment: Rarely   Family History  Problem Relation Age of Onset  . Colon polyps Mother   . Colitis Mother   . Cancer       aunt-lymphoma   Allergies  Allergen Reactions  . Other Nausea Only    ALL NARCOTICS   No current outpatient prescriptions on file prior to visit.   No current facility-administered medications on file prior to visit.    Review of Systems Review of Systems  Constitutional: Negative for fever, appetite change, fatigue and unexpected weight change.  Eyes: Negative for pain and visual disturbance.  Respiratory: Negative for cough and shortness of breath.   Cardiovascular: Negative for cp or palpitations    Gastrointestinal: Negative for nausea, diarrhea and constipation.  Genitourinary: Negative for urgency and frequency.  Skin: Negative for pallor or rash   Neurological: Negative for weakness, light-headedness, numbness and headaches.  Hematological: Negative for adenopathy. Does not bruise/bleed easily.  Psychiatric/Behavioral: pos for improved depression, pos for worsening anxiety, neg for SI         Objective:   Physical Exam  Constitutional: She appears well-developed and well-nourished. No distress.  overwt and well app  HENT:  Head: Normocephalic and atraumatic.  Mouth/Throat: Oropharynx is clear and moist.  Eyes: Conjunctivae and EOM are normal. Pupils are equal, round, and reactive to light.  Neck: Normal range of motion. Neck supple. No JVD present. Carotid bruit is not present. No thyromegaly present.  Cardiovascular: Normal rate, regular rhythm, normal heart sounds and intact distal pulses.  Exam reveals no gallop.   Pulmonary/Chest: Effort normal and breath sounds normal. No respiratory distress. She has no wheezes. She has no rales.  No crackles  Abdominal: Soft. Bowel sounds are normal. She exhibits no distension, no abdominal bruit and no mass. There is no tenderness.  Musculoskeletal: She exhibits no edema.  Lymphadenopathy:    She has no cervical adenopathy.  Neurological: She is alert. She has normal reflexes.  Skin: Skin is warm and dry. No rash noted.    Psychiatric: Her speech is normal and behavior is normal. Thought content normal. Her mood appears anxious. Her affect is not blunt, not labile and not inappropriate. Thought content is not paranoid. She does not exhibit a depressed mood. She expresses no homicidal and no suicidal ideation.  Pleasant and talkative Open to discuss stressors           Assessment & Plan:   Problem List Items Addressed This Visit      Other   Anxiety    In pt with hx of recent  PP depression  Reviewed stressors/ coping techniques/symptoms/ support sources/ tx options and side effects in detail today  Many stressors Enc to start counseling (couples), as well as individual Goal -to reduce klonopin which she takes avg 1 mg bid  Will change her prozac to paxil 60 mg to see if more eff for anxiety Discussed expectations of SSRI medication including time to effectiveness and mechanism of action, also poss of side effects (early and late)- including mental fuzziness, weight or appetite change, nausea and poss of worse dep or anxiety (even suicidal thoughts)  Pt voiced understanding and will stop med and update if this occurs   Rev imp of self care F/u 4-6 weeks  Will try to gradually reduce klonopin as able -refilled today-will fill px when done with short amt given last time       Relevant Medications   PARoxetine (PAXIL) 30 MG tablet    Other Visit Diagnoses    Need for influenza vaccination    -  Primary    Relevant Orders    Flu Vaccine QUAD 36+ mos PF IM (Fluarix & Fluzone Quad PF) (Completed)

## 2015-06-12 ENCOUNTER — Telehealth: Payer: Self-pay | Admitting: Family Medicine

## 2015-06-12 ENCOUNTER — Telehealth: Payer: Self-pay

## 2015-06-12 DIAGNOSIS — F32A Depression, unspecified: Secondary | ICD-10-CM | POA: Insufficient documentation

## 2015-06-12 DIAGNOSIS — F419 Anxiety disorder, unspecified: Secondary | ICD-10-CM

## 2015-06-12 DIAGNOSIS — F329 Major depressive disorder, single episode, unspecified: Secondary | ICD-10-CM | POA: Insufficient documentation
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
## 2015-06-12 NOTE — Telephone Encounter (Signed)
Pt left vm that the paxil was giving her suicidal thoughts and wanted to change medications. I spoke with pt and she said she had already spoken with Dr Milinda Antis and nothing further needed. Will forward this note to Dr Milinda Antis as Lorain Childes.

## 2015-06-13 NOTE — Telephone Encounter (Signed)
Called pt's only number, left 2 messages requesting a return call re: appointment already scheduled for patient.  Pt needs to speak to Laura Hamilton re: her referral.

## 2015-06-13 NOTE — Telephone Encounter (Signed)
Spoke to spouse [id number].  He states don't need to schedule, pt wants to select her own doctor.  Explained that this type of referral is one of our more challenging because the doctors are so busy.  Recommended that pt keep her appointment with Dr. Daleen Bo and continue to do research if that is her choice.  (sometimes doctors are not accepting new patients or the wait time to get in the practice  is excessive.)

## 2015-06-21 ENCOUNTER — Emergency Department (HOSPITAL_COMMUNITY)
Admission: EM | Admit: 2015-06-21 | Discharge: 2015-06-22 | Disposition: A | Discharge status: Other Acute Inpatient Hospital | Payer: BLUE CROSS/BLUE SHIELD | Attending: Emergency Medicine | Admitting: Emergency Medicine

## 2015-06-21 ENCOUNTER — Encounter (HOSPITAL_COMMUNITY): Payer: Self-pay

## 2015-06-21 DIAGNOSIS — Z3202 Encounter for pregnancy test, result negative: Secondary | ICD-10-CM | POA: Insufficient documentation

## 2015-06-21 DIAGNOSIS — Q829 Congenital malformation of skin, unspecified: Secondary | ICD-10-CM | POA: Insufficient documentation

## 2015-06-21 DIAGNOSIS — Z79899 Other long term (current) drug therapy: Secondary | ICD-10-CM | POA: Diagnosis not present

## 2015-06-21 DIAGNOSIS — G43909 Migraine, unspecified, not intractable, without status migrainosus: Secondary | ICD-10-CM | POA: Diagnosis not present

## 2015-06-21 DIAGNOSIS — F419 Anxiety disorder, unspecified: Secondary | ICD-10-CM | POA: Insufficient documentation

## 2015-06-21 DIAGNOSIS — F1210 Cannabis abuse, uncomplicated: Secondary | ICD-10-CM | POA: Insufficient documentation

## 2015-06-21 DIAGNOSIS — Y9389 Activity, other specified: Secondary | ICD-10-CM | POA: Insufficient documentation

## 2015-06-21 DIAGNOSIS — Z872 Personal history of diseases of the skin and subcutaneous tissue: Secondary | ICD-10-CM | POA: Diagnosis not present

## 2015-06-21 DIAGNOSIS — R45851 Suicidal ideations: Secondary | ICD-10-CM | POA: Diagnosis present

## 2015-06-21 DIAGNOSIS — Y998 Other external cause status: Secondary | ICD-10-CM | POA: Diagnosis not present

## 2015-06-21 DIAGNOSIS — Z87891 Personal history of nicotine dependence: Secondary | ICD-10-CM | POA: Diagnosis not present

## 2015-06-21 DIAGNOSIS — T43222A Poisoning by selective serotonin reuptake inhibitors, intentional self-harm, initial encounter: Secondary | ICD-10-CM | POA: Insufficient documentation

## 2015-06-21 DIAGNOSIS — F329 Major depressive disorder, single episode, unspecified: Secondary | ICD-10-CM | POA: Diagnosis not present

## 2015-06-21 DIAGNOSIS — T424X2A Poisoning by benzodiazepines, intentional self-harm, initial encounter: Secondary | ICD-10-CM | POA: Insufficient documentation

## 2015-06-21 DIAGNOSIS — T50902A Poisoning by unspecified drugs, medicaments and biological substances, intentional self-harm, initial encounter: Secondary | ICD-10-CM

## 2015-06-21 DIAGNOSIS — Y9289 Other specified places as the place of occurrence of the external cause: Secondary | ICD-10-CM | POA: Diagnosis not present

## 2015-06-21 DIAGNOSIS — F1310 Sedative, hypnotic or anxiolytic abuse, uncomplicated: Secondary | ICD-10-CM | POA: Insufficient documentation

## 2015-06-21 HISTORY — DX: Depression, unspecified: F32.A

## 2015-06-21 HISTORY — DX: Anxiety disorder, unspecified: F41.9

## 2015-06-21 HISTORY — DX: Major depressive disorder, single episode, unspecified: F32.9

## 2015-06-21 LAB — URINALYSIS, ROUTINE W REFLEX MICROSCOPIC
GLUCOSE, UA: NEGATIVE mg/dL
Hgb urine dipstick: NEGATIVE
Leukocytes, UA: NEGATIVE
NITRITE: NEGATIVE
PH: 6 (ref 5.0–8.0)
PROTEIN: NEGATIVE mg/dL
Specific Gravity, Urine: 1.026 (ref 1.005–1.030)

## 2015-06-21 LAB — CBC WITH DIFFERENTIAL/PLATELET
BASOS ABS: 0.1 10*3/uL (ref 0.0–0.1)
Basophils Relative: 0 %
Eosinophils Absolute: 0.5 10*3/uL (ref 0.0–0.7)
Eosinophils Relative: 5 %
HEMATOCRIT: 42.5 % (ref 36.0–46.0)
HEMOGLOBIN: 14.8 g/dL (ref 12.0–15.0)
LYMPHS PCT: 25 %
Lymphs Abs: 2.9 10*3/uL (ref 0.7–4.0)
MCH: 31.8 pg (ref 26.0–34.0)
MCHC: 34.8 g/dL (ref 30.0–36.0)
MCV: 91.4 fL (ref 78.0–100.0)
Monocytes Absolute: 0.7 10*3/uL (ref 0.1–1.0)
Monocytes Relative: 6 %
NEUTROS ABS: 7.4 10*3/uL (ref 1.7–7.7)
Neutrophils Relative %: 64 %
Platelets: 252 10*3/uL (ref 150–400)
RBC: 4.65 MIL/uL (ref 3.87–5.11)
RDW: 12.3 % (ref 11.5–15.5)
WBC: 11.6 10*3/uL — AB (ref 4.0–10.5)

## 2015-06-21 LAB — COMPREHENSIVE METABOLIC PANEL
ALT: 13 U/L — ABNORMAL LOW (ref 14–54)
AST: 15 U/L (ref 15–41)
Albumin: 4.3 g/dL (ref 3.5–5.0)
Alkaline Phosphatase: 68 U/L (ref 38–126)
Anion gap: 11 (ref 5–15)
BILIRUBIN TOTAL: 0.7 mg/dL (ref 0.3–1.2)
BUN: 12 mg/dL (ref 6–20)
CHLORIDE: 108 mmol/L (ref 101–111)
CO2: 21 mmol/L — ABNORMAL LOW (ref 22–32)
CREATININE: 0.58 mg/dL (ref 0.44–1.00)
Calcium: 9.2 mg/dL (ref 8.9–10.3)
Glucose, Bld: 89 mg/dL (ref 65–99)
POTASSIUM: 3.8 mmol/L (ref 3.5–5.1)
Sodium: 140 mmol/L (ref 135–145)
TOTAL PROTEIN: 7.5 g/dL (ref 6.5–8.1)

## 2015-06-21 LAB — RAPID URINE DRUG SCREEN, HOSP PERFORMED
Amphetamines: NOT DETECTED
BARBITURATES: NOT DETECTED
Benzodiazepines: POSITIVE — AB
Cocaine: NOT DETECTED
Opiates: NOT DETECTED
Tetrahydrocannabinol: POSITIVE — AB

## 2015-06-21 LAB — ETHANOL: Alcohol, Ethyl (B): <5 mg/dL (ref <5)

## 2015-06-21 LAB — SALICYLATE LEVEL

## 2015-06-21 LAB — ACETAMINOPHEN LEVEL

## 2015-06-21 LAB — HCG, SERUM, QUALITATIVE: PREG SERUM: NEGATIVE

## 2015-06-21 MED ORDER — ONDANSETRON HCL 4 MG PO TABS: 4.0000 mg | ORAL_TABLET | Freq: Three times a day (TID) | ORAL | Status: DC | PRN

## 2015-06-21 MED ORDER — ALUM & MAG HYDROXIDE-SIMETH 200-200-20 MG/5ML PO SUSP: 30.0000 mL | ORAL | Status: DC | PRN

## 2015-06-21 MED ORDER — ACETAMINOPHEN 325 MG PO TABS: 650.0000 mg | ORAL_TABLET | ORAL | Status: DC | PRN

## 2015-06-21 MED ORDER — IBUPROFEN 200 MG PO TABS: 600.0000 mg | ORAL_TABLET | Freq: Three times a day (TID) | ORAL | Status: DC | PRN

## 2015-06-21 MED ORDER — LORAZEPAM 1 MG PO TABS
1.0000 mg | ORAL_TABLET | Freq: Three times a day (TID) | ORAL | Status: DC | PRN
  Administered 2015-06-22: 1 mg via ORAL
  Filled 2015-06-21: qty 1

## 2015-06-21 MED ORDER — SODIUM CHLORIDE 0.9 % IV BOLUS (SEPSIS)
1000.0000 mL | Freq: Once | INTRAVENOUS | Status: AC
  Administered 2015-06-21: 1000 mL via INTRAVENOUS
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
## 2015-06-21 NOTE — BH Assessment (Signed)
Assessment completed. Consulted Hulan Fess, NP who recommended that pt have a psychiatric evaluation.
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
## 2015-06-21 NOTE — ED Notes (Signed)
Patiaent took her husband's Klonopin, but states that she did take her own Prozac 1 1/2 bottles, but states she does not know how many she took or the dosage.

## 2015-06-21 NOTE — ED Notes (Signed)
Husband at bedside.  

## 2015-06-22 ENCOUNTER — Inpatient Hospital Stay (HOSPITAL_COMMUNITY)
Admission: AD | Admit: 2015-06-22 | Discharge: 2015-06-27 | DRG: 885 | Disposition: A | Discharge status: Home / Self Care | Payer: BLUE CROSS/BLUE SHIELD | Source: Intra-hospital | Attending: Psychiatry | Admitting: Psychiatry

## 2015-06-22 ENCOUNTER — Encounter (HOSPITAL_COMMUNITY): Payer: Self-pay | Admitting: *Deleted

## 2015-06-22 DIAGNOSIS — F329 Major depressive disorder, single episode, unspecified: Secondary | ICD-10-CM | POA: Diagnosis present

## 2015-06-22 DIAGNOSIS — R45851 Suicidal ideations: Secondary | ICD-10-CM | POA: Diagnosis not present

## 2015-06-22 DIAGNOSIS — Z87891 Personal history of nicotine dependence: Secondary | ICD-10-CM | POA: Diagnosis not present

## 2015-06-22 DIAGNOSIS — F1210 Cannabis abuse, uncomplicated: Secondary | ICD-10-CM | POA: Insufficient documentation

## 2015-06-22 DIAGNOSIS — F339 Major depressive disorder, recurrent, unspecified: Secondary | ICD-10-CM | POA: Diagnosis present

## 2015-06-22 DIAGNOSIS — F332 Major depressive disorder, recurrent severe without psychotic features: Secondary | ICD-10-CM | POA: Diagnosis present

## 2015-06-22 DIAGNOSIS — T424X2A Poisoning by benzodiazepines, intentional self-harm, initial encounter: Secondary | ICD-10-CM | POA: Diagnosis not present

## 2015-06-22 MED ORDER — ACETAMINOPHEN 325 MG PO TABS
650.0000 mg | ORAL_TABLET | Freq: Four times a day (QID) | ORAL | Status: DC | PRN
  Administered 2015-06-22: 650 mg via ORAL
  Filled 2015-06-22: qty 2

## 2015-06-22 MED ORDER — LORAZEPAM 1 MG PO TABS
1.0000 mg | ORAL_TABLET | Freq: Three times a day (TID) | ORAL | Status: DC | PRN
  Administered 2015-06-22 – 2015-06-27 (×11): 1 mg via ORAL
  Filled 2015-06-22 (×11): qty 1

## 2015-06-22 MED ORDER — ALUM & MAG HYDROXIDE-SIMETH 200-200-20 MG/5ML PO SUSP: 30.0000 mL | ORAL | Status: DC | PRN

## 2015-06-22 MED ORDER — ACETAMINOPHEN 325 MG PO TABS
650.0000 mg | ORAL_TABLET | ORAL | Status: DC | PRN
  Administered 2015-06-24: 650 mg via ORAL
  Filled 2015-06-22: qty 2

## 2015-06-22 MED ORDER — IBUPROFEN 600 MG PO TABS: 600.0000 mg | ORAL_TABLET | Freq: Three times a day (TID) | ORAL | Status: DC | PRN

## 2015-06-22 MED ORDER — ONDANSETRON HCL 4 MG PO TABS: 4.0000 mg | ORAL_TABLET | Freq: Three times a day (TID) | ORAL | Status: DC | PRN

## 2015-06-22 MED ORDER — TRAZODONE HCL 50 MG PO TABS
50.0000 mg | ORAL_TABLET | Freq: Once | ORAL | Status: AC
  Administered 2015-06-22: 50 mg via ORAL
  Filled 2015-06-22 (×2): qty 1

## 2015-06-22 MED ORDER — MAGNESIUM HYDROXIDE 400 MG/5ML PO SUSP: 30.0000 mL | Freq: Every day | ORAL | Status: DC | PRN

## 2015-06-22 NOTE — ED Notes (Signed)
Pt. Noted sleeping in room. No complaints or concerns voiced. No distress or abnormal behavior noted. Will continue to monitor with security cameras. Q 15 minute rounds continue. 

## 2015-06-22 NOTE — ED Notes (Signed)
Pt. To [facility] from ED ambulatory without difficulty, to room 37 . Report from [facility]. Pt. Is tearfull alert and oriented, warm and dry in no acute distress. Pt. Denies SI, HI, and AVH. Pt. Calm and cooperative. Pt. Made aware of security cameras and Q15 minute rounds. Pt. Encouraged to let Nursing staff know of any concerns or needs.

## 2015-06-22 NOTE — ED Notes (Signed)
Pt wanded by security. Pts belongings are being taken home by her Husband.

## 2015-06-22 NOTE — ED Notes (Signed)
Pt ambulatory w/o difficulty to [facility] w/ [facility], belongings given to driver. 

## 2015-06-22 NOTE — ED Notes (Signed)
Pt is unsure how many tabs she took thinks thinks she took approx 20-30 klonipin and a full bottle of prozac (30 day supply--takes 60 mg daily-/20 mg tabs/).  [facility] contacted for transport.  Contact information give to pt for husband.
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
## 2015-06-22 NOTE — ED Notes (Signed)
Pt's husband into see 

## 2015-06-22 NOTE — ED Notes (Signed)
Up to the bathroom 

## 2015-06-22 NOTE — ED Notes (Addendum)
Pt became tearful and emotianally in duress. Pt states she does not want to stay at this present time and would like to go home. Pt informed by RN that this current treatment plan is necessary to facilitate proper resources. Pt understands her current voluntary status. Pt given PO Ativan and emotional support from husband and RN to combat current anxiety. Pt and husband given a printed form of [employer] and regulations. Pt states she will refuse to speak to psychiatrist due to the fact she has an appointment with her personal psychiatrist on 2/23.

## 2015-06-22 NOTE — Tx Team (Signed)
Initial Interdisciplinary Treatment Plan   PATIENT STRESSORS: Marital or family conflict Medication change or noncompliance   PATIENT STRENGTHS: Average or above average intelligence Capable of independent living Communication skills Physical Health Supportive family/friends   PROBLEM LIST: Problem List/Patient Goals Date to be addressed Date deferred Reason deferred Estimated date of resolution  "I want to work on my depression and anxiety" 06/22/2015     Suicidal Ideation 06/22/2015     Marital Conflict 06/22/2015     Explosiveness 06/22/2015                                    DISCHARGE CRITERIA:  Motivation to continue treatment in a less acute level of care Need for constant or close observation no longer present Reduction of life-threatening or endangering symptoms to within safe limits Verbal commitment to aftercare and medication compliance  PRELIMINARY DISCHARGE PLAN: Outpatient therapy Participate in family therapy Return to previous living arrangement  PATIENT/FAMIILY INVOLVEMENT: This treatment plan has been presented to and reviewed with the patient, Laura Hamilton.  The patient and family have been given the opportunity to ask questions and make suggestions.  [place]ranford Mon 06/22/2015, 3:36 PM

## 2015-06-22 NOTE — [facility] Counselor (Signed)
Patient reviewed and signed voluntary consent to treat and ROI to her husband. Patient denies questions or concerns at this time. Patients paperwork faxed to [facility] and copy provided to nurse to transport with patient via [facility] to [facility].   Davina Poke, LCSW Therapeutic Triage Specialist [facility] 06/22/2015 1:31 PM

## 2015-06-22 NOTE — ED Notes (Signed)
Up to the bathroom th shower and change scrubs

## 2015-06-22 NOTE — ED Notes (Signed)
On the phone 

## 2015-06-23 DIAGNOSIS — R45851 Suicidal ideations: Secondary | ICD-10-CM

## 2015-06-23 DIAGNOSIS — F332 Major depressive disorder, recurrent severe without psychotic features: Principal | ICD-10-CM

## 2015-06-23 DIAGNOSIS — F1210 Cannabis abuse, uncomplicated: Secondary | ICD-10-CM

## 2015-06-23 MED ORDER — BUSPIRONE HCL 5 MG PO TABS
5.0000 mg | ORAL_TABLET | Freq: Two times a day (BID) | ORAL | Status: DC
  Administered 2015-06-23 – 2015-06-26 (×6): 5 mg via ORAL
  Filled 2015-06-23 (×11): qty 1

## 2015-06-23 MED ORDER — BUPROPION HCL 75 MG PO TABS
75.0000 mg | ORAL_TABLET | Freq: Two times a day (BID) | ORAL | Status: DC
  Administered 2015-06-23 – 2015-06-24 (×3): 75 mg via ORAL
  Filled 2015-06-23 (×7): qty 1

## 2015-06-23 NOTE — [facility] Group Notes (Signed)
[facility] Group Notes:  (Nursing/MHT/Case Management/Adjunct)  Date:  06/23/2015  Time:  10:37 AM  Type of Therapy:  Psychoeducational Skills  Participation Level:  Did Not Attend  Participation Quality:  Did Not Attend  Affect:  Did Not Attend  Cognitive:  Did Not Attend  Insight:  None  Engagement in Group:  Did Not Attend  Modes of Intervention:  Did Not Attend  Summary of Progress/Problems: Pt did not attend patient self inventory group.    Honestee Revard Shanta 06/23/2015, 10:37 AM 

## 2015-06-23 NOTE — [facility] Group Notes (Signed)
[facility] LCSW Group Therapy Note   06/23/2015  10:00 AM   Type of Therapy and Topic: Group Therapy: Feelings Around Returning Home & Establishing a Supportive Framework and Activity to Identify signs of Improvement or Decompensation   Participation Level: Active   Description of Group:  Patients first processed thoughts and feelings about up coming discharge. These included fears of upcoming changes, lack of change, new living environments, judgements and expectations from others and overall stigma of MH issues. We then discussed what is a supportive framework? What does it look like feel like and how do I discern it from and unhealthy non-supportive network? Learn how to cope when supports are not helpful and don't support you. Discuss what to do when your family/friends are not supportive.   Therapeutic Goals Addressed in Processing Group:  1. Patient will identify one healthy supportive network that they can use at discharge. 2. Patient will identify one factor of a supportive framework and how to tell it from an unhealthy network. 3. Patient able to identify one coping skill to use when they do not have positive supports from others. 4. Patient will demonstrate ability to communicate their needs through discussion and/or role plays.  Summary of Patient Progress:  As group progressed pt engaged increasingly.. As patients processed their anxiety about discharge and described healthy supports patient reported she has no supports in the area. Others offered understanding and support as patient shared.  Patient chose a visual to represent decompensation as "being locked behind doors to describe her isolation" and improvement as "being a member of a family that is truly happy." Patient processed that waiting for family to welcome here (now going on 11th year) is not helpful and she was open to [facility] supports.  Carney Bern, LCSW

## 2015-06-23 NOTE — Progress Notes (Signed)
Group Note  Pt attended wrap-up group; participated actively. Appropriate behavior. 
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
## 2015-06-23 NOTE — Progress Notes (Signed)
Patient ID: Laura Hamilton, female   DOB: 1975-05-17, 40 y.o.   MRN: [id number] D: Client pleasant upon approach, soft spoken, asks for toiletries. Client reports goals for admission "get help with depression and anger" Client proceeds to become teary as she talks about her baby, "I visited with them in the cafeteria, but it was only for thirty minutes" Client expresses anxiety with being away from baby and hospitalization. A:Writer provided emotional support, administered Ativan 1 mg orally for anxiety. Client encouraged to follow up with writer with any other concerns. Staff will monitor q32min for safety. R: client is safe on the unit.

## 2015-06-23 NOTE — Progress Notes (Signed)
Patient ID: Laura Hamilton, female   DOB: 1976/04/07, 40 y.o.   MRN: [id number]  DAR: Pt. Denies SI/HI and A/V Hallucinations but is able to contract for safety if feeling unsafe. She reports sleep is fair, appetite is poor, energy level is normal, and concentration is good. She rates depression 7/10, hopelessness 10/10, and anxiety 8/10.  Patient reported pain in her teeth from, "not being able to floss." Support and encouragement provided to the patient. Scheduled medications administered to patient per physician's orders. Patient is tearful during morning assessment. Patient appears attention seeking, calm one moment but crying as she sees Clinical research associate observing her. She received PRN Ativan for anxiety. Q15 minute checks are maintained for safety.

## 2015-06-23 NOTE — Progress Notes (Signed)
Patient ID: Laura Hamilton, female   DOB: May 14, 1975, 40 y.o.   MRN: [id number] D: Client observed at main nurses station retrieving items that were brought in by family. Client asking if she could have items that facility did not allow. Client noticed getting agitated as certain items were rejected. Client began to escalate and became tearful. A: Writer called client to the medication window to administer sleep medication. Medication reviewed and administered as ordered. Writer began to explain to client the protocol set for items kept on the floor. Client responded in tears "so are you getting angry with me because I'm feeling emotional" Writer proceeded to tell client that she was only trying to explain policy. "they already told me that" Writer apologized and client returned to main nurses station. Client began to escalate even more telling staff she had a cousin that worked in [place] and a mother-in-law who was also employed by the [employer] system. Client then proceeded to call her husband. R: Writer continued to calm client. Client referred to Olin Pia. Consulting civil engineer.

## 2015-06-23 NOTE — [facility] Suicide Risk Assessment (Signed)
[facility] Admission Suicide Risk Assessment   Nursing information obtained from:    Demographic factors:    Current Mental Status:    Loss Factors:    Historical Factors:    Risk Reduction Factors:     Total Time spent with patient: 1.5 hours Principal Problem: Major depressive disorder, recurrent, severe without psychotic features (HCC) Diagnosis:   Patient Active Problem List   Diagnosis Date Noted  . Major depressive disorder, recurrent, severe without psychotic features (HCC) [F33.2] 06/22/2015  . Depression [F32.9] 06/12/2015  . Active labor at term [O80] 11/30/2014  . Supervision of normal pregnancy [Z34.90] 04/03/2014  . Advanced maternal age in multigravida [O09.529] 04/03/2014  . Low back pain [M54.5] 12/26/2012  . Hyperlipidemia [E78.5] 08/26/2010  . Anxiety [F41.9] 08/26/2010  . History of acne [Z87.2]   . Keratosis pilaris [Q82.9]   . Personal history of tobacco use [Z87.891]   . TMJ PAIN [M26.629] 03/13/2009  . PREMENSTRUAL DYSPHORIC SYNDROME [N94.3] 07/25/2008  . MENORRHAGIA [N92.0] 07/14/2007  . MIGRAINES, HX OF [Z87.898] 10/08/2006   Subjective Data: Alert oriented, remains anxious. Slept poorly. Crying spells with dysphoric mood.  Continued Clinical Symptoms:  Alcohol Use Disorder Identification Test Final Score (AUDIT): 1 The "Alcohol Use Disorders Identification Test", Guidelines for Use in Primary Care, Second Edition.  World Science writer [facility]). Score between 0-7:  no or low risk or alcohol related problems. Score between 8-15:  moderate risk of alcohol related problems. Score between 16-19:  high risk of alcohol related problems. Score 20 or above:  warrants further diagnostic evaluation for alcohol dependence and treatment.   CLINICAL FACTORS:   Severe Anxiety and/or Agitation Depression:   Anhedonia Hopelessness Impulsivity Alcohol/Substance Abuse/Dependencies More than one psychiatric diagnosis Unstable or Poor Therapeutic  Relationship Previous Psychiatric Diagnoses and Treatments   Musculoskeletal: Strength & Muscle Tone: within normal limits Gait & Station: normal Patient leans: no lean  Psychiatric Specialty Exam: Review of Systems  Constitutional: Negative for fever.  Cardiovascular: Negative for chest pain.  Skin: Negative for rash.  Psychiatric/Behavioral: Positive for depression. The patient is nervous/anxious and has insomnia.     Blood pressure 113/82, pulse 99, temperature 98.1 F (36.7 C), temperature source Oral, resp. rate 17, height  (1.626 m), weight 72.576 kg (160 lb), not currently breastfeeding.Body mass index is 27.45 kg/(m^2).  General Appearance: Casual  Eye Contact::  Fair  Speech:  Normal Rate  Volume:  Increased  Mood:  Anxious and Dysphoric  Affect:  Congruent   Thought Process:  Circumstantial  Orientation:  Full (Time, Place, and Person)  Thought Content:  Rumination  Suicidal Thoughts:  No  Homicidal Thoughts:  No  Memory:  Immediate;   Fair Recent;   Fair  Judgement:  Poor  Insight:  Shallow  Psychomotor Activity:  Increased  Concentration:  Fair  Recall:  [employer] of Knowledge:Fair  Language: Fair  Akathisia:  Negative  Handed:  Right  AIMS (if indicated):     Assets:  Desire for Improvement  Sleep:     Cognition: WNL  ADL's:  Intact    COGNITIVE FEATURES THAT CONTRIBUTE TO RISK:  Polarized thinking    SUICIDE RISK:   Moderate:  Frequent suicidal ideation with limited intensity, and duration, some specificity in terms of plans, no associated intent, good self-control, limited dysphoria/symptomatology, some risk factors present, and identifiable protective factors, including available and accessible social support.  PLAN OF CARE: Admit for stabilization of depression, safety and medication management.  I certify that  inpatient services furnished can reasonably be expected to improve the patient's condition.   Thresa Ross, MD 06/23/2015,  10:31 AM

## 2015-06-24 ENCOUNTER — Telehealth: Payer: Self-pay | Admitting: Family Medicine

## 2015-06-24 MED ORDER — BUPROPION HCL ER (XL) 150 MG PO TB24
150.0000 mg | ORAL_TABLET | Freq: Every day | ORAL | Status: DC
  Administered 2015-06-25 – 2015-06-27 (×3): 150 mg via ORAL
  Filled 2015-06-24 (×7): qty 1

## 2015-06-24 MED ORDER — TRAZODONE HCL 50 MG PO TABS
50.0000 mg | ORAL_TABLET | Freq: Every evening | ORAL | Status: DC | PRN
  Administered 2015-06-24 – 2015-06-25 (×2): 50 mg via ORAL

## 2015-06-24 MED ORDER — BACITRACIN-NEOMYCIN-POLYMYXIN OINTMENT TUBE
TOPICAL_OINTMENT | CUTANEOUS | Status: DC | PRN
  Filled 2015-06-24: qty 1
  Filled 2015-06-24: qty 15

## 2015-06-24 MED ORDER — ARIPIPRAZOLE 2 MG PO TABS
2.0000 mg | ORAL_TABLET | Freq: Every day | ORAL | Status: DC
  Administered 2015-06-24 – 2015-06-26 (×3): 2 mg via ORAL
  Filled 2015-06-24 (×6): qty 1

## 2015-06-24 NOTE — [facility] Group Notes (Signed)
[facility] LCSW Aftercare Discharge Planning Group Note  06/24/2015 8:45 AM  Participation Quality: Alert, Appropriate and Oriented  Mood/Affect: Appropriate  Depression Rating: 8  Anxiety Rating: 10  Thoughts of Suicide: Pt denies SI/HI  Will you contract for safety? Yes  Current AVH: Pt denies  Plan for Discharge/Comments: Pt attended discharge planning group and actively participated in group. CSW discussed suicide prevention education with the group and encouraged them to discuss discharge planning and any relevant barriers. Pt expresses that she is feeling depressed this morning.  Pt reports that she has been referred to a psychiatrist by her PCP and has an appointment on 2/23.   Transportation Means: Pt reports access to transportation  Supports: No supports mentioned at this time  Chad Cordial, LCSWA 06/24/2015 4:02 PM

## 2015-06-24 NOTE — Plan of Care (Signed)
Problem: Diagnosis: Increased Risk For Suicide Attempt Goal: STG-Patient Will Attend All Groups On The Unit Outcome: Progressing Pt attended evening group on 06/23/15     

## 2015-06-24 NOTE — Progress Notes (Signed)
D: Pt was in the dayroom upon initial approach.  She reports she had a good visit with her husband tonight.  She reports her goal is "to feel better" and that she feels better than she did yesterday.  She denies SI/HI, denies hallucinations, denies pain.  She became tearful while talking with Probation officer about the anxiety she has when going to group and talking to new people.  Pt attended evening group tonight.    A: Introduced self to pt.  Met with pt 1:1 and provided support and encouragement.  PRN medication administered for anxiety.    R: Pt was less anxious after administration of PRN medication.  She was laughing in the dayroom and playing charades with her peers.  She verbally contracts for safety and reports that she will inform staff of needs and concerns.  Will continue to monitor and assess.

## 2015-06-24 NOTE — Progress Notes (Signed)
Patient ID: Laura Hamilton, female   DOB: 06/12/1975, 40 y.o.   MRN: [id number]  DAR: Pt. Denies SI/HI and A/V Hallucinations. She is able to contract for safety if she is feeling unsafe. She reports sleep is poor, appetite is poor, energy level is normal, and concentration is poor. She rates depression 7/10, hopelessness 9/10, and anxiety 6/10. Patient reported a headache this morning and received Tylenol which provided relief. Support and encouragement provided to the patient. Scheduled medications administered to patient per physician's orders. Patient is receptive and cooperative. She is seen in the milieu and is attending groups. Q15 minute checks are maintained for safety.

## 2015-06-24 NOTE — Progress Notes (Signed)
[facility] MD Progress Note  06/24/2015 5:10 PM Laura Hamilton  MRN:  [id number]  Subjective:  "I did not sleep well last night"  Objective: Laura Hamilton is seen chart reviewed. She continue to endorse symptoms of depression. She feels remorseful about her suicide attempt by an overdose. She is visible on the unit, participating in group milieu. She currently denies any sihi.  Principal Problem: Major depressive disorder, recurrent, severe without psychotic features (HCC)  Diagnosis:   Patient Active Problem List   Diagnosis Date Noted  . Marijuana abuse [F12.10]   . Major depressive disorder, recurrent, severe without psychotic features (HCC) [F33.2] 06/22/2015  . Depression [F32.9] 06/12/2015  . Active labor at term [O80] 11/30/2014  . Supervision of normal pregnancy [Z34.90] 04/03/2014  . Advanced maternal age in multigravida [O09.529] 04/03/2014  . Low back pain [M54.5] 12/26/2012  . Hyperlipidemia [E78.5] 08/26/2010  . Anxiety [F41.9] 08/26/2010  . History of acne [Z87.2]   . Keratosis pilaris [Q82.9]   . Personal history of tobacco use [Z87.891]   . TMJ PAIN [M26.629] 03/13/2009  . PREMENSTRUAL DYSPHORIC SYNDROME [N94.3] 07/25/2008  . MENORRHAGIA [N92.0] 07/14/2007  . MIGRAINES, HX OF [Z87.898] 10/08/2006   Total Time spent with patient: 25 minutes  Past Psychiatric History: See H&P  Past Medical History:  Past Medical History  Diagnosis Date  . History of acne     s/p tx with accutane  . Migraines   . PMDD (premenstrual dysphoric disorder)   . Keratosis pilaris   . Personal history of tobacco use   . Depression   . Anxiety     Past Surgical History  Procedure Laterality Date  . Wisdom tooth extraction     Family History:  Family History  Problem Relation Age of Onset  . Colon polyps Mother   . Colitis Mother   . Cancer      aunt-lymphoma   Family Psychiatric  History: See H&P  Social History:  History  Alcohol Use  . 0.0 oz/week  . 0 Standard drinks or  equivalent per week    Comment: occasionally     History  Drug Use No    Social History   Social History  . Marital Status: Married    Spouse Name: N/A  . Number of Children: 1  . Years of Education: N/A   Occupational History  . Insurance    Social History Main Topics  . Smoking status: Former Smoker    Types: Cigarettes  . Smokeless tobacco: None  . Alcohol Use: 0.0 oz/week    0 Standard drinks or equivalent per week     Comment: occasionally  . Drug Use: No  . Sexual Activity: Yes    Birth Control/ Protection: None   Other Topics Concern  . None   Social History Narrative   Additional Social History:   Sleep: Fair  Appetite:  Fair  Current Medications: Current Facility-Administered Medications  Medication Dose Route Frequency Provider Last Rate Last Dose  . acetaminophen (TYLENOL) tablet 650 mg  650 mg Oral Q4H PRN Earney Navy, NP   650 mg at 06/24/15 0747  . alum & mag hydroxide-simeth (MAALOX/MYLANTA) 200-200-20 MG/5ML suspension 30 mL  30 mL Oral PRN Earney Navy, NP      . buPROPion [facility]) tablet 75 mg  75 mg Oral BID Oneta Rack, NP   75 mg at 06/24/15 1710  . busPIRone (BUSPAR) tablet 5 mg  5 mg Oral BID Oneta Rack, NP   5  mg at 06/24/15 1710  . ibuprofen (ADVIL,MOTRIN) tablet 600 mg  600 mg Oral Q8H PRN Earney Navy, NP      . LORazepam (ATIVAN) tablet 1 mg  1 mg Oral Q8H PRN Earney Navy, NP   1 mg at 06/24/15 0842  . magnesium hydroxide (MILK OF MAGNESIA) suspension 30 mL  30 mL Oral Daily PRN Earney Navy, NP      . ondansetron (ZOFRAN) tablet 4 mg  4 mg Oral Q8H PRN Earney Navy, NP       Lab Results: No results found for this or any previous visit (from the past 48 hour(s)).  Blood Alcohol level:  Lab Results  Component Value Date   ETH <5 06/21/2015   Physical Findings: AIMS: Facial and Oral Movements Muscles of Facial Expression: None, normal Lips and Perioral Area: None, normal Jaw: None,  normal Tongue: None, normal,Extremity Movements Upper (arms, wrists, hands, fingers): None, normal Lower (legs, knees, ankles, toes): None, normal, Trunk Movements Neck, shoulders, hips: None, normal, Overall Severity Severity of abnormal movements (highest score from questions above): None, normal Incapacitation due to abnormal movements: None, normal Patient's awareness of abnormal movements (rate only patient's report): No Awareness, Dental Status Current problems with teeth and/or dentures?: No Does patient usually wear dentures?: No  CIWA:    COWS:     Musculoskeletal: Strength & Muscle Tone: within normal limits Gait & Station: normal Patient leans: N/A  Psychiatric Specialty Exam: Review of Systems  Constitutional: Negative.   HENT: Negative.   Eyes: Negative.   Respiratory: Negative.   Cardiovascular: Negative.   Gastrointestinal: Negative.   Genitourinary: Negative.   Musculoskeletal: Negative.   Skin: Negative.   Neurological: Negative.   Endo/Heme/Allergies: Negative.   Psychiatric/Behavioral: Positive for depression and substance abuse. Negative for suicidal ideas (Cannabis abuse), hallucinations and memory loss. The patient has insomnia. The patient is not nervous/anxious.     Blood pressure 127/82, pulse 98, temperature 98 F (36.7 C), temperature source Oral, resp. rate 16, height  (1.626 m), weight 72.576 kg (160 lb), not currently breastfeeding.Body mass index is 27.45 kg/(m^2).  General Appearance: Casual  Eye Contact:: Fair  Speech: Clear and Coherent  Volume: Normal  Mood: Depressed  Affect: Congruent, Flat and Tearful  Thought Process: Linear and Logical  Orientation: Full (Time, Place, and Person)  Thought Content: Hallucinations: None  Suicidal Thoughts: Yes. with intent/plan Passive thoughts today, contracts for safety while on the unit.   Homicidal Thoughts: No  Memory: Immediate; Fair Recent; Fair Remote;  Fair  Judgement: Fair  Insight: Fair  Psychomotor Activity: Restlessness  Concentration: Fair  Recall: [employer] of Knowledge:Fair  Language: Fair  Akathisia: No  Handed: Right  AIMS (if indicated):    Assets: Desire for Improvement Resilience Social Support  ADL's: Intact  Cognition: WNL  Sleep:5.75          Treatment plan/summary 1. Continue crisis management, mood stabilization & relapse prevention.. 2. Continue current medication management to reduce current symptoms to base line and improve the  patient's overall level of functioning; Changed Wellbutrin 100 mg bid to Wellbutrin XL 150 mg for depression, initiated Abilify 2 mg Q bedtime for mood control/adjunct treatment for depression, continue Buspar 5 mg for anxiety, Lorazepam 1 mg for severe anxiety & trazodone 50 mg for insomnia. 3. Treat health problems as indicated. 4. Develop treatment plan to enhance medication adeherance upon discharge & decrease the need for  readmission. 5. Psycho-social education regarding relapse  prevention and self care  Sanjuana Kava, NP, PMHNP, FNP-BC 06/24/2015, 5:10 PM

## 2015-06-24 NOTE — Telephone Encounter (Signed)
FMLA dropped off for pt spouse Alycia Rossetti to be out while caring for infant while pt is in hospital and recovery. He is requesting 2 weeks (06/21/15-07/04/15). Forms given to Robin H for completion.  Dr. Milinda Antis, Alycia Rossetti would like you to call him when you have a minute to discuss update on pt. [phone]

## 2015-06-24 NOTE — Progress Notes (Signed)
Group Note Pt attended wrap-up group; actively participated. Appropriate behavior. 

## 2015-06-24 NOTE — [facility] Group Notes (Signed)
[facility] LCSW Group Therapy  06/24/2015 1:15pm  Type of Therapy:  Group Therapy vercoming Obstacles  Participation Level:  Active  Participation Quality:  Appropriate   Affect:  Appropriate  Cognitive:  Appropriate and Oriented  Insight:  Developing/Improving and Improving  Engagement in Therapy:  Improving  Modes of Intervention:  Discussion, Exploration, Problem-solving and Support  Description of Group:   In this group patients will be encouraged to explore what they see as obstacles to their own wellness and recovery. They will be guided to discuss their thoughts, feelings, and behaviors related to these obstacles. The group will process together ways to cope with barriers, with attention given to specific choices patients can make. Each patient will be challenged to identify changes they are motivated to make in order to overcome their obstacles. This group will be process-oriented, with patients participating in exploration of their own experiences as well as giving and receiving support and challenge from other group members.  Summary of Patient Progress: Pt expressed that she feels that loneliness and lack of support are her main obstacles at this point. Pt reports that this is further complicated by anxiety so she tends to isolate her self. Pt interacted well with peers and was receptive to feedback.    Therapeutic Modalities:   Cognitive Behavioral Therapy Solution Focused Therapy Motivational Interviewing Relapse Prevention Therapy   Chad Cordial, LCSWA 06/24/2015 4:04 PM

## 2015-06-24 NOTE — Telephone Encounter (Signed)
I spoke to him- she is medically safe and in behavioral health at [facility] now - he thinks she is starting to improve after an overdose He thinks her psychiatrist is suspicious of bipolar dz and may tx that  I do not have his paperwork back yet-please get it to me and I will sign it  thanks

## 2015-06-24 NOTE — Progress Notes (Signed)
D:Patient in her room on approach.  Patient states she had a decent day.  Patient states her anxiety has been high today.  Patient states she had a good visit with her husband but felt she was anxious when he was here.  Patient states her goal for today was to be more positive.  Patient states she is continuing to work on it and will work on it again Advertising account executive.  Patient denies SI/HI and denies AVH. A: Staff to monitor Q 15 mins for safety.  Encouragement and support offered.  Scheduled medications administered per orders. R: Patient remains safe on the unit.  Patient attended group tonight.  Patient visible on unit and interacting with peers.  Patient taking administered medications.

## 2015-06-25 MED ORDER — TRAZODONE HCL 50 MG PO TABS
50.0000 mg | ORAL_TABLET | Freq: Every evening | ORAL | Status: DC | PRN
  Administered 2015-06-25 – 2015-06-26 (×2): 50 mg via ORAL
  Filled 2015-06-25 (×2): qty 1

## 2015-06-25 NOTE — Progress Notes (Signed)
[facility] Group Notes:  (Nursing/MHT/Case Management/Adjunct)  Date:  06/25/2015  Time:  2100 Type of Therapy:  wrap up group  Participation Level:  Active  Participation Quality:  Appropriate, Attentive, Sharing and Supportive  Affect:  Appropriate  Cognitive:  Appropriate  Insight:  Lacking  Engagement in Group:  Engaged  Modes of Intervention:  Clarification, Education and Support  Summary of Progress/Problems:  Pt shared that her husband is very supportive and has taken time off from work to help care for her son and her after discharge. Pt shared that she had been very depressed and wants to pursue peer to peer review and a women's group after discharge. [place] him anywhere. Pt reported she would prefer the move since currently they live on a street along with many many members of his family.   Shelah Lewandowsky 06/25/2015, 10:21 PM

## 2015-06-25 NOTE — Telephone Encounter (Signed)
Given to Robin 

## 2015-06-25 NOTE — Plan of Care (Signed)
Problem: Ineffective individual coping Goal: STG: Patient will remain free from self harm Outcome: Progressing Patient remains free from self harm at this time     

## 2015-06-25 NOTE — B[facility] Group Notes (Signed)
[facility] LCSW Group Therapy 06/25/2015 1:15 PM  Type of Therapy: Group Therapy- Feelings about Diagnosis  Participation Level: Active   Participation Quality:  Appropriate  Affect:  Appropriate  Cognitive: Alert and Oriented   Insight:  Developing   Engagement in Therapy: Developing/Improving and Engaged   Modes of Intervention: Clarification, Confrontation, Discussion, Education, Exploration, Limit-setting, Orientation, Problem-solving, Rapport Building, Dance movement psychotherapist, Socialization and Support  Description of Group:   This group will allow patients to explore their thoughts and feelings about diagnoses they have received. Patients will be guided to explore their level of understanding and acceptance of these diagnoses. Facilitator will encourage patients to process their thoughts and feelings about the reactions of others to their diagnosis, and will guide patients in identifying ways to discuss their diagnosis with significant others in their lives. This group will be process-oriented, with patients participating in exploration of their own experiences as well as giving and receiving support and challenge from other group members.  Summary of Progress/Problems:  Pt discussed how she feels her traumatic childhood created unhealthy habits as well as negative thinking patterns. She was able to articulate questions about her symptoms and take advice from others.  Therapeutic Modalities:   Cognitive Behavioral Therapy Solution Focused Therapy Motivational Interviewing Relapse Prevention Therapy  Chad Cordial, LCSWA 06/25/2015 4:06 PM

## 2015-06-25 NOTE — Progress Notes (Signed)
Patient ID: Laura Hamilton, female   DOB: 22-Dec-1975, 40 y.o.   MRN: [id number]  DAR: Pt. Denies SI/HI and A/V Hallucinations. She is able to contract for safety. She reports sleep is poor, appetite is poor, energy level is low, and concentration is good. She rates depression 6/10, hopelessness 3/10, and anxiety 8/10. Patient does not report any pain or discomfort at this time. Support and encouragement provided to the patient. Scheduled medications administered to patient per physician's orders. Patient is receptive and cooperative. She is seen in the milieu interacting with peers and is attending groups. She does report anxiety and is receiving PRn medication for this which has been providing relief. Writer spoke 1:1 with patient about her anxiety and coping skills and making sure she takes time for herself.  Q15 minute checks are maintained for safety.

## 2015-06-25 NOTE — [facility] Group Notes (Signed)
[facility] Group Notes:  (Nursing/MHT/Case Management/Adjunct)  Date:  06/25/2015  Time:  11:45 AM  Type of Therapy:  Psychoeducational Skills  Participation Level:  Active  Participation Quality:  Appropriate  Affect:  Appropriate  Cognitive:  Appropriate  Insight:  Appropriate  Engagement in Group:  Engaged and Supportive  Modes of Intervention:  Discussion and Education  Summary of Progress/Problems: Laura Hamilton attended group and was engaged. She did not speak but was nodding her head when nursing staff were speaking about the topic of the day which was Recovery. Patient received daily workbook as well.   Ned Clines, Maurico Perrell E 06/25/2015, 11:45 AM

## 2015-06-25 NOTE — Telephone Encounter (Signed)
Spoke with pt spouse Alycia Rossetti He wanted paperwork faxed to sheila traywick @ city of [place]  He gave a business card with faxed number that was secured # Spouse aware paperwork faxed to number he provided [phone] Copy for pt  Copy for file Copy for scan

## 2015-06-25 NOTE — Progress Notes (Signed)
Recreation Therapy Notes  Animal-Assisted Activity (AAA) Program Checklist/Progress Notes Patient Eligibility Criteria Checklist & Daily Group note for Rec Tx Intervention  Date: 02.21.2017 Time: 2:45pm Location: [address]   AAA/T Program Assumption of Risk Form signed by Patient/ or Parent Legal Guardian yes  Patient is free of allergies or sever asthma yes  Patient reports no fear of animals yes  Patient reports no history of cruelty to animals yes  Patient understands his/her participation is voluntary yes  Patient washes hands before animal contact yes  Patient washes hands after animal contact yes  Behavioral Response: Appropriate, Engaged   Education: Charity fundraiser, Appropriate Animal Interaction   Education Outcome: Acknowledges education.   Clinical Observations/Feedback: Patient appropriately engaged with therapy dog and peers in session. Patient pet therapy dog appropriately from floor level and shared stories about his pets at home with group.   Laura Hamilton, LRT/CTRS  Laura Hamilton 06/25/2015 3:16 PM

## 2015-06-25 NOTE — Tx Team (Signed)
Interdisciplinary Treatment Plan Update (Adult) Date: 06/25/2015   Date: 06/25/2015 8:33 AM  Progress in Treatment:  Attending groups: Yes  Participating in groups: Yes  Taking medication as prescribed: Yes  Tolerating medication: Yes  Family/Significant othe contact made: No, CSW attempting to make contact with husband Patient understands diagnosis: Yes AEB seeking help with depression Discussing patient identified problems/goals with staff: Yes  Medical problems stabilized or resolved: Yes  Denies suicidal/homicidal ideation: Yes Patient has not harmed self or Others: Yes   New problem(s) identified: None identified at this time.   Discharge Plan or Barriers: Pt will return home and follow-up with outpatient resources  Additional comments:  Patient and CSW reviewed pt's identified goals and treatment plan. Patient verbalized understanding and agreed to treatment plan. CSW reviewed [facility] "Discharge Process and Patient Involvement" Form. Pt verbalized understanding of information provided and signed form.   Reason for Continuation of Hospitalization:  Anxiety Depression Medication stabilization Suicidal ideation  Estimated length of stay: 2-3 days  Review of initial/current patient goals per problem list:   1.  Goal(s): Patient will participate in aftercare plan  Met:  Yes  Target date: 3-5 days from date of admission   As evidenced by: Patient will participate within aftercare plan AEB aftercare provider and housing plan at discharge being identified.   06/25/15: Pt will return home and follow-up with outpatient resources  2.  Goal (s): Patient will exhibit decreased depressive symptoms and suicidal ideations.  Met:  No  Target date: 3-5 days from date of admission   As evidenced by: Patient will utilize self rating of depression at 3 or below and demonstrate decreased signs of depression or be deemed stable for discharge by MD.  06/25/15: Pt rates depression at 8/10 and  has tearful affect at times. Pt denies SI  3.  Goal(s): Patient will demonstrate decreased signs and symptoms of anxiety.  Met:  No  Target date: 3-5 days from date of admission   As evidenced by: Patient will utilize self rating of anxiety at 3 or below and demonstrated decreased signs of anxiety, or be deemed stable for discharge by MD  06/25/15: Pt rates anxiety at 10/10.   Attendees:  Patient:    Family:    Physician: Dr. Parke Poisson, MD  06/25/2015 8:33 AM  Nursing: Lars Pinks, RN Case manager  06/25/2015 8:33 AM  Clinical Social Worker Peri Maris, Latanya Presser 06/25/2015 8:33 AM  Other: Tilden Fossa, [place] 06/25/2015 8:33 AM  Clinical: Darrol Angel, RN; Christa Drinkard 06/25/2015 8:33 AM  Other: , RN Charge Nurse 06/25/2015 8:33 AM  Other: Hilda Lias, [place], [place] Social Work [phone]

## 2015-06-25 NOTE — Progress Notes (Signed)
Patient ID: Laura Hamilton, female   DOB: 05-19-1975, 40 y.o.   MRN: [id number] [facility] MD Progress Note  06/25/2015 7:56 PM Cana Mignano  MRN:  [id number]  Subjective:  Patient reports some improvement compared to admission, but still feels depressed, anxious .  Objective:  Patient seen and case discussed with treatment team. History of  depression, suicide attempt by overdosing , which she states occurred in the context of argument with husband. States that she is now feeling better, less depressed, and denies any ongoing suicidal ideations. Reports improved relationship with her husband , and he has been visiting her regularly- states " things are better now", and expressed a sense of regret regarding her overdose.  With patient's express consent and in her presence I spoke with her husband via phone , who corroborates that patient presents improved and is doing better .  Patient is hoping to be discharged soon. She has been visible in day room, going to groups.  Denies medication side effects  Principal Problem: Major depressive disorder, recurrent, severe without psychotic features (HCC)  Diagnosis:   Patient Active Problem List   Diagnosis Date Noted  . Marijuana abuse [F12.10]   . Major depressive disorder, recurrent, severe without psychotic features (HCC) [F33.2] 06/22/2015  . Depression [F32.9] 06/12/2015  . Active labor at term [O80] 11/30/2014  . Supervision of normal pregnancy [Z34.90] 04/03/2014  . Advanced maternal age in multigravida [O09.529] 04/03/2014  . Low back pain [M54.5] 12/26/2012  . Hyperlipidemia [E78.5] 08/26/2010  . Anxiety [F41.9] 08/26/2010  . History of acne [Z87.2]   . Keratosis pilaris [Q82.9]   . Personal history of tobacco use [Z87.891]   . TMJ PAIN [M26.629] 03/13/2009  . PREMENSTRUAL DYSPHORIC SYNDROME [N94.3] 07/25/2008  . MENORRHAGIA [N92.0] 07/14/2007  . MIGRAINES, HX OF [Z87.898] 10/08/2006   Total Time spent with patient: 25 minutes  Past  Psychiatric History: See H&P  Past Medical History:  Past Medical History  Diagnosis Date  . History of acne     s/p tx with accutane  . Migraines   . PMDD (premenstrual dysphoric disorder)   . Keratosis pilaris   . Personal history of tobacco use   . Depression   . Anxiety     Past Surgical History  Procedure Laterality Date  . Wisdom tooth extraction     Family History:  Family History  Problem Relation Age of Onset  . Colon polyps Mother   . Colitis Mother   . Cancer      aunt-lymphoma   Family Psychiatric  History: See H&P  Social History:  History  Alcohol Use  . 0.0 oz/week  . 0 Standard drinks or equivalent per week    Comment: occasionally     History  Drug Use No    Social History   Social History  . Marital Status: Married    Spouse Name: N/A  . Number of Children: 1  . Years of Education: N/A   Occupational History  . Insurance    Social History Main Topics  . Smoking status: Former Smoker    Types: Cigarettes  . Smokeless tobacco: None  . Alcohol Use: 0.0 oz/week    0 Standard drinks or equivalent per week     Comment: occasionally  . Drug Use: No  . Sexual Activity: Yes    Birth Control/ Protection: None   Other Topics Concern  . None   Social History Narrative   Additional Social History:   Sleep: improved   Appetite:  Improved   Current Medications: Current Facility-Administered Medications  Medication Dose Route Frequency Provider Last Rate Last Dose  . acetaminophen (TYLENOL) tablet 650 mg  650 mg Oral Q4H PRN Earney Navy, NP   650 mg at 06/24/15 0747  . alum & mag hydroxide-simeth (MAALOX/MYLANTA) 200-200-20 MG/5ML suspension 30 mL  30 mL Oral PRN Earney Navy, NP      . ARIPiprazole (ABILIFY) tablet 2 mg  2 mg Oral QHS Sanjuana Kava, NP   2 mg at 06/24/15 2205  . buPROPion (WELLBUTRIN XL) 24 hr tablet 150 mg  150 mg Oral Daily Sanjuana Kava, NP   150 mg at 06/25/15 4098  . busPIRone (BUSPAR) tablet 5 mg  5  mg Oral BID Oneta Rack, NP   5 mg at 06/25/15 1721  . ibuprofen (ADVIL,MOTRIN) tablet 600 mg  600 mg Oral Q8H PRN Earney Navy, NP      . LORazepam (ATIVAN) tablet 1 mg  1 mg Oral Q8H PRN Earney Navy, NP   1 mg at 06/25/15 1306  . magnesium hydroxide (MILK OF MAGNESIA) suspension 30 mL  30 mL Oral Daily PRN Earney Navy, NP      . neomycin-bacitracin-polymyxin (NEOSPORIN) ointment   Topical PRN Kerry Hough, PA-C      . ondansetron (ZOFRAN) tablet 4 mg  4 mg Oral Q8H PRN Earney Navy, NP      . traZODone (DESYREL) tablet 50 mg  50 mg Oral QHS PRN,MR X 1 Spencer E Simon, PA-C   50 mg at 06/25/15 1191   Lab Results: No results found for this or any previous visit (from the past 48 hour(s)).  Blood Alcohol level:  Lab Results  Component Value Date   ETH <5 06/21/2015   Physical Findings: AIMS: Facial and Oral Movements Muscles of Facial Expression: None, normal Lips and Perioral Area: None, normal Jaw: None, normal Tongue: None, normal,Extremity Movements Upper (arms, wrists, hands, fingers): None, normal Lower (legs, knees, ankles, toes): None, normal, Trunk Movements Neck, shoulders, hips: None, normal, Overall Severity Severity of abnormal movements (highest score from questions above): None, normal Incapacitation due to abnormal movements: None, normal Patient's awareness of abnormal movements (rate only patient's report): No Awareness, Dental Status Current problems with teeth and/or dentures?: No Does patient usually wear dentures?: No  CIWA:    COWS:     Musculoskeletal: Strength & Muscle Tone: within normal limits Gait & Station: normal Patient leans: N/A  Psychiatric Specialty Exam: Review of Systems  Constitutional: Negative.   HENT: Negative.   Eyes: Negative.   Respiratory: Negative.   Cardiovascular: Negative.   Gastrointestinal: Negative.   Genitourinary: Negative.   Musculoskeletal: Negative.   Skin: Negative.   Neurological:  Negative.   Endo/Heme/Allergies: Negative.   Psychiatric/Behavioral: Positive for depression and substance abuse. Negative for suicidal ideas (Cannabis abuse), hallucinations and memory loss. The patient has insomnia. The patient is not nervous/anxious.     Blood pressure 140/78, pulse 110, temperature 97.8 F (36.6 C), temperature source Oral, resp. rate 16, height  (1.626 m), weight 160 lb (72.576 kg), not currently breastfeeding.Body mass index is 27.45 kg/(m^2).  General Appearance: well groomed   Eye Contact:: good   Speech: Clear and Coherent  Volume: Normal  Mood: reports improved mood, less depressed   Affect: reactive, smiles at times appropriately, some anxiety   Thought Process: Linear and Logical  Orientation: Full (Time, Place, and Person)  Thought Content: no hallucinations, no  delusions  Suicidal Thoughts: no, denies any self injurious ideations, denies any suicidal ideations   Homicidal Thoughts: No  Memory: recent and remote grossly intact   Judgement: improved   Insight:  improved  Psychomotor Activity: normal   Concentration:  Good   Recall: good   Fund of Knowledge: good   Language: good   Akathisia: No  Handed: Right  AIMS (if indicated):    Assets: Desire for Improvement Resilience Social Support  ADL's: Intact  Cognition: WNL  Sleep:5.75         Assessment - patient reports improved mood and denies any ongoing self injurious or suicidal ideations. Visible in day room, behavior in good control.  Tolerating medications well thus far . Hoping for discharge soon.  Treatment plan/summary Continue crisis management, mood stabilization- encourage ongoing group, milieu participation Continue Wellbutrin XL 150 mg  QAM for depression Continue Abilify 2 mg QDAY  As adjunct treatment for depression Continue   Buspar 5 mg BID for  Anxiety Continue Trazodone 50 mg for insomnia.   Nehemiah Massed, MD,   06/25/2015, 7:56  PM
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
## 2015-06-25 NOTE — Telephone Encounter (Signed)
FMLA paperwork  In Dr Royden Purl IN BOX

## 2015-06-25 NOTE — Telephone Encounter (Signed)
Done and in IN box 

## 2015-06-26 LAB — TSH: TSH: 1.208 u[IU]/mL (ref 0.350–4.500)

## 2015-06-26 MED ORDER — BUSPIRONE HCL 10 MG PO TABS
10.0000 mg | ORAL_TABLET | Freq: Two times a day (BID) | ORAL | Status: DC
  Administered 2015-06-26 – 2015-06-27 (×2): 10 mg via ORAL
  Filled 2015-06-26 (×6): qty 1

## 2015-06-26 NOTE — [facility] Group Notes (Signed)
[facility] LCSW Group Therapy 06/26/2015 1:15 PM  Type of Therapy: Group Therapy- Emotion Regulation  Participation Level: Minimal  Participation Quality:  Withdrawn  Affect: Flat  Cognitive: Alert and Oriented   Insight:  Developing/Improving  Engagement in Therapy: Limited  Modes of Intervention: Clarification, Confrontation, Discussion, Education, Exploration, Limit-setting, Orientation, Problem-solving, Rapport Building, Dance movement psychotherapist, Socialization and Support  Summary of Progress/Problems: The topic for group today was emotional regulation. This group focused on both positive and negative emotion identification and allowed group members to process ways to identify feelings, regulate negative emotions, and find healthy ways to manage internal/external emotions. Group members were asked to reflect on a time when their reaction to an emotion led to a negative outcome and explored how alternative responses using emotion regulation would have benefited them. Group members were also asked to discuss a time when emotion regulation was utilized when a negative emotion was experienced. Pt participated minimally in group discussion and expressed that she did not feel like sharing because she was unable to take her anti-anxiety medication prior to coming in to group. Pt became tearful briefly when sharing this.   Chad Cordial, LCSWA 06/26/2015 5:13 PM

## 2015-06-26 NOTE — Progress Notes (Signed)
Patient ID: Laura Hamilton, female   DOB: 1975-11-23, 40 y.o.   MRN: [id number] [facility] MD Progress Note  06/26/2015 11:14 AM Laura Hamilton  MRN:  [id number]  Subjective:  Reports improvement compared to admission, but  Reports ongoing anxiety and and sadness. Ruminates about an RN having been " rude " with her on day of admission.  Denies medication side effects at this time.   Objective:  Patient seen and case discussed with treatment team. Today reports feeling better compared to admission, but does report ongoing depression and anxiety.  At this time reports anxiety as major symptom. As above, presents ruminative about a recent  interaction with an Charity fundraiser.  Responsive to support, empathy, and affect improved during session. She denies medication side effects. She reports sense of optimism that medications are helping . No disruptive behaviors on unit, going to some groups. Patient reports history of depression, anxiety, some social anxiety, although this tends to improve once she " gets to know the people I am interacting with". Patient has a 7 month child- currently being taken care of by father, who is taking time off from work for the purpose. Patient denies current breast feeding .  No psychotic symptoms noted or reported . States she has a strong sense of bonding with her child, and denies any violent ideations towards child or family .   Principal Problem: Major depressive disorder, recurrent, severe without psychotic features (HCC)  Diagnosis:   Patient Active Problem List   Diagnosis Date Noted  . Marijuana abuse [F12.10]   . Major depressive disorder, recurrent, severe without psychotic features (HCC) [F33.2] 06/22/2015  . Depression [F32.9] 06/12/2015  . Active labor at term [O80] 11/30/2014  . Supervision of normal pregnancy [Z34.90] 04/03/2014  . Advanced maternal age in multigravida [O09.529] 04/03/2014  . Low back pain [M54.5] 12/26/2012  . Hyperlipidemia [E78.5] 08/26/2010  .  Anxiety [F41.9] 08/26/2010  . History of acne [Z87.2]   . Keratosis pilaris [Q82.9]   . Personal history of tobacco use [Z87.891]   . TMJ PAIN [M26.629] 03/13/2009  . PREMENSTRUAL DYSPHORIC SYNDROME [N94.3] 07/25/2008  . MENORRHAGIA [N92.0] 07/14/2007  . MIGRAINES, HX OF [Z87.898] 10/08/2006   Total Time spent with patient: 25 minutes  Past Psychiatric History: See H&P  Past Medical History:  Past Medical History  Diagnosis Date  . History of acne     s/p tx with accutane  . Migraines   . PMDD (premenstrual dysphoric disorder)   . Keratosis pilaris   . Personal history of tobacco use   . Depression   . Anxiety     Past Surgical History  Procedure Laterality Date  . Wisdom tooth extraction     Family History:  Family History  Problem Relation Age of Onset  . Colon polyps Mother   . Colitis Mother   . Cancer      aunt-lymphoma   Family Psychiatric  History: See H&P  Social History:  History  Alcohol Use  . 0.0 oz/week  . 0 Standard drinks or equivalent per week    Comment: occasionally     History  Drug Use No    Social History   Social History  . Marital Status: Married    Spouse Name: N/A  . Number of Children: 1  . Years of Education: N/A   Occupational History  . Insurance    Social History Main Topics  . Smoking status: Former Smoker    Types: Cigarettes  . Smokeless tobacco: None  .  Alcohol Use: 0.0 oz/week    0 Standard drinks or equivalent per week     Comment: occasionally  . Drug Use: No  . Sexual Activity: Yes    Birth Control/ Protection: None   Other Topics Concern  . None   Social History Narrative   Additional Social History:   Sleep: improved   Appetite:   Improved   Current Medications: Current Facility-Administered Medications  Medication Dose Route Frequency Provider Last Rate Last Dose  . acetaminophen (TYLENOL) tablet 650 mg  650 mg Oral Q4H PRN Earney Navy, NP   650 mg at 06/24/15 0747  . alum & mag  hydroxide-simeth (MAALOX/MYLANTA) 200-200-20 MG/5ML suspension 30 mL  30 mL Oral PRN Earney Navy, NP      . ARIPiprazole (ABILIFY) tablet 2 mg  2 mg Oral QHS Sanjuana Kava, NP   2 mg at 06/25/15 2131  . buPROPion (WELLBUTRIN XL) 24 hr tablet 150 mg  150 mg Oral Daily Sanjuana Kava, NP   150 mg at 06/26/15 0757  . busPIRone (BUSPAR) tablet 5 mg  5 mg Oral BID Oneta Rack, NP   5 mg at 06/26/15 0757  . ibuprofen (ADVIL,MOTRIN) tablet 600 mg  600 mg Oral Q8H PRN Earney Navy, NP      . LORazepam (ATIVAN) tablet 1 mg  1 mg Oral Q8H PRN Earney Navy, NP   1 mg at 06/26/15 0645  . magnesium hydroxide (MILK OF MAGNESIA) suspension 30 mL  30 mL Oral Daily PRN Earney Navy, NP      . neomycin-bacitracin-polymyxin (NEOSPORIN) ointment   Topical PRN Kerry Hough, PA-C      . ondansetron (ZOFRAN) tablet 4 mg  4 mg Oral Q8H PRN Earney Navy, NP      . traZODone (DESYREL) tablet 50 mg  50 mg Oral QHS PRN Craige Cotta, MD   50 mg at 06/25/15 2309   Lab Results: No results found for this or any previous visit (from the past 48 hour(s)).  Blood Alcohol level:  Lab Results  Component Value Date   ETH <5 06/21/2015   Physical Findings: AIMS: Facial and Oral Movements Muscles of Facial Expression: None, normal Lips and Perioral Area: None, normal Jaw: None, normal Tongue: None, normal,Extremity Movements Upper (arms, wrists, hands, fingers): None, normal Lower (legs, knees, ankles, toes): None, normal, Trunk Movements Neck, shoulders, hips: None, normal, Overall Severity Severity of abnormal movements (highest score from questions above): None, normal Incapacitation due to abnormal movements: None, normal Patient's awareness of abnormal movements (rate only patient's report): No Awareness, Dental Status Current problems with teeth and/or dentures?: No Does patient usually wear dentures?: No  CIWA:    COWS:     Musculoskeletal: Strength & Muscle Tone: within  normal limits Gait & Station: normal Patient leans: N/A  Psychiatric Specialty Exam: Review of Systems  Constitutional: Negative.   HENT: Negative.   Eyes: Negative.   Respiratory: Negative.   Cardiovascular: Negative.   Gastrointestinal: Negative.   Genitourinary: Negative.   Musculoskeletal: Negative.   Skin: Negative.   Neurological: Negative.   Endo/Heme/Allergies: Negative.   Psychiatric/Behavioral: Positive for depression and substance abuse. Negative for suicidal ideas (Cannabis abuse), hallucinations and memory loss. The patient has insomnia. The patient is not nervous/anxious.     Blood pressure 112/78, pulse 110, temperature 99.6 F (37.6 C), temperature source Oral, resp. rate 20, height  (1.626 m), weight 160 lb (72.576 kg), not currently  breastfeeding.Body mass index is 27.45 kg/(m^2).  General Appearance: well groomed   Eye Contact:: good   Speech: Clear and Coherent  Volume: Normal  Mood:  Partially improved, but still depressed , anxious  Affect: constricted, anxious, but improves as session progresses   Thought Process: Linear and Logical  Orientation: Full (Time, Place, and Person)  Thought Content: no hallucinations, no delusions, not internally preoccupied   Suicidal Thoughts: no, denies any self injurious ideations, denies any suicidal ideations   Homicidal Thoughts: denies any violent or homicidal ideations   Memory: recent and remote grossly intact   Judgement: improved   Insight:  improved  Psychomotor Activity: normal   Concentration:  Good   Recall: good   Fund of Knowledge: good   Language: good   Akathisia: No  Handed: Right  AIMS (if indicated):    Assets: Desire for Improvement Resilience Social Support  ADL's: Intact  Cognition: WNL  Sleep:5.75         Assessment - patient reports partial improvement of mood , but endorses ongoing depression, anxiety, and was tearful at times today, although  affect improved with support, encouragement. Denies SI. No psychotic symptoms and reports a strong sense of bonding with her infant child and a good strong relationship with husband . Thus far tolerating medications well . Treatment plan/summary Continue crisis management, mood stabilization- encourage ongoing group, milieu participation Continue Wellbutrin XL 150 mg  QAM for depression Continue Abilify 2 mg QDAY  As adjunct treatment for depression Increase   Buspar to 10  mg BID for  Anxiety Continue Trazodone 50 mg for insomnia. Check TSH.     Nehemiah Massed, MD,   06/26/2015, 11:14 AM

## 2015-06-26 NOTE — Progress Notes (Signed)
DAR NOTE: Pt present with flat affect and depressed mood in the unit. Pt has been observed in the milieu interacting with peers.Pt denies physical pain, took all her meds as scheduled. As per self inventory, pt had a fair night sleep, poor appetite, low energy, and good concentration. Pt rate depression at 5, hopeless ness at 3, and anxiety at a 8 Pt states goal for today is to "maintain positive attitude." Pt's safety ensured with 15 minute and environmental checks. Pt currently denies SI/HI and A/V hallucinations. Pt verbally agrees to seek staff if SI/HI or A/VH occurs and to consult with staff before acting on these thoughts. Will continue POC.

## 2015-06-26 NOTE — Progress Notes (Signed)
Adult Psychoeducational Group Note  Date:  06/26/2015 Time:  10:35 PM  Group Topic/Focus:  Wrap-Up Group:   The focus of this group is to help patients review their daily goal of treatment and discuss progress on daily workbooks.  Participation Level:  Active  Participation Quality:  Appropriate  Affect:  Appropriate  Cognitive:  Alert  Insight: Appropriate  Engagement in Group:  Engaged  Modes of Intervention:  Discussion  Additional Comments:  Patient goal for today was to stay positive. On a scale between 1-10, (1=worse, 10=best) patient rated her day an 8 because "a situation occurred".  Laura Hamilton L Laura Hamilton 06/26/2015, 10:35 PM

## 2015-06-26 NOTE — Progress Notes (Signed)
Recreation Therapy Notes  Date: 02.22.2017 Time: 9:30am Location: [address] Group Room   Group Topic: Stress Management  Goal Area(s) Addresses:  Patient will actively participate in stress management techniques presented during session.   Behavioral Response: Did not attend.   Tamaiya Bump L Keian Odriscoll, LRT/CTRS        Paisly Fingerhut L 06/26/2015 10:21 AM 

## 2015-06-26 NOTE — Plan of Care (Signed)
Problem: Alteration in mood Goal: LTG-Patient reports reduction in suicidal thoughts (Patient reports reduction in suicidal thoughts and is able to verbalize a safety plan for whenever patient is feeling suicidal)  Outcome: Progressing Pt denies SI tonight and verbally contracts for safety.       

## 2015-06-26 NOTE — Progress Notes (Signed)
Pt reported that she had an episode of emesis this morning.  PRN medication for nausea/vomiting was offered and pt declined, stating "that's okay, I'm fine now.  I'm about to go to breakfast."  Pt denies needs and concerns at this time.  Will continue to monitor and assess.

## 2015-06-26 NOTE — Progress Notes (Signed)
D: Pt has anxious affect and mood.  She reports she had a good visit with her husband tonight.  She reports the best part of her day was "talking to the doctor and I thought group was really good."  Pt denies SI/HI, denies hallucinations, denies pain.  Pt has been visible in milieu interacting with peers and staff appropriately.  Pt attended evening group.   A:  Met with pt and offered support and encouragement.  Medications administered per order.  PRN medication administered for sleep and anxiety. R: Pt is compliant with medications.  Pt verbally contracts for safety.  Will continue to monitor and assess.

## 2015-06-26 NOTE — [facility] Suicide Risk Assessment (Signed)
[facility] INPATIENT:  Family/Significant Other Suicide Prevention Education  Suicide Prevention Education:  Education Completed; Bexlee Bergdoll, Pt's husband [phone],  has been identified by the patient as the family member/significant other with whom the patient will be residing, and identified as the person(s) who will aid the patient in the event of a mental health crisis (suicidal ideations/suicide attempt).  With written consent from the patient, the family member/significant other has been provided the following suicide prevention education, prior to the and/or following the discharge of the patient.  The suicide prevention education provided includes the following:  Suicide risk factors  Suicide prevention and interventions  National Suicide Hotline telephone number  [facility] assessment telephone number  [facility] Emergency Assistance 911  [facility] and/or Residential Mobile Crisis Unit telephone number  Request made of family/significant other to:  Remove weapons (e.g., guns, rifles, knives), all items previously/currently identified as safety concern.    Remove drugs/medications (over-the-counter, prescriptions, illicit drugs), all items previously/currently identified as a safety concern.  The family member/significant other verbalizes understanding of the suicide prevention education information provided.  The family member/significant other agrees to remove the items of safety concern listed above.  Elaina Hoops 06/26/2015, 10:02 AM

## 2015-06-27 ENCOUNTER — Ambulatory Visit: Payer: Self-pay | Admitting: Psychiatry

## 2015-06-27 MED ORDER — ARIPIPRAZOLE 2 MG PO TABS: 2.0000 mg | ORAL_TABLET | Freq: Every day | ORAL | Status: DC

## 2015-06-27 MED ORDER — BUPROPION HCL ER (XL) 150 MG PO TB24: 150.0000 mg | ORAL_TABLET | Freq: Every day | ORAL | Status: DC

## 2015-06-27 MED ORDER — TRAZODONE HCL 50 MG PO TABS: 50.0000 mg | ORAL_TABLET | Freq: Every evening | ORAL | Status: DC | PRN

## 2015-06-27 MED ORDER — BUSPIRONE HCL 10 MG PO TABS: 10.0000 mg | ORAL_TABLET | Freq: Two times a day (BID) | ORAL | Status: DC

## 2015-06-27 NOTE — Progress Notes (Signed)
D: Patient verbalizes readiness for discharge. Denies suicidal and homicidal ideations. Denies auditory and visual hallucinations.  No complaints of pain.  A:  Both husbandt and patient receptive to discharge instructions. Questions encouraged, both verbalize understanding. [facility] Transitional record and Suicide Risk Assessment reviewed and given to the pt at time of discharge.  R:  Escorted to the lobby by this RN.

## 2015-06-27 NOTE — Progress Notes (Signed)
  [facility] Adult Case Management Discharge Plan :  Will you be returning to the same living situation after discharge:  Yes,  Pt returning home At discharge, do you have transportation home?: Yes,  husband to provide transportation Do you have the ability to pay for your medications: Yes,  Pt provided with prescriptions  Release of information consent forms completed and in the chart;  Patient's signature needed at discharge.  Patient to Follow up at: Follow-up Information    Follow up with [facility] On 07/15/2015.   Why:  at 10:00am for medication management with Dr. Salvatore Decent information:   [address] [place], [place] [address] [phone]      Follow up with Pt plans to schedule therapy with husband's EAP .      Next level of care provider has access to [facility] Link:no  Safety Planning and Suicide Prevention discussed: Yes,  with husband; see SPE note for further details  Have you used any form of tobacco in the last 30 days? (Cigarettes, Smokeless Tobacco, Cigars, and/or Pipes): No  Has patient been referred to the Quitline?: N/A patient is not a smoker  Patient has been referred for addiction treatment: N/A  Elaina Hoops 06/27/2015, 9:56 AM

## 2015-06-27 NOTE — Progress Notes (Addendum)
D: Pt presents flat, depressed, and anxious. Pt's affect brightens upon interaction. Pt reports having anxiety r/t to her upcoming family session meeting on Thursday. Pt verbalizes the need for her husband to be supportive/informative of her aftercare plans. Pt is currently denying any SI/HI/AVH. Pt observed interacting appropriately with her peers within the milieu.  A: Writer administered scheduled and prn medications to pt, per MD orders. Continued support and availability as needed was extended to this pt. Staff continues to monitor pt with q102min checks.  R: No adverse drug reactions noted. Pt receptive to treatment. Pt remains safe at this time.

## 2015-06-27 NOTE — Discharge Summary (Signed)
Physician Discharge Summary Note  Patient:  Laura Hamilton is an 40 y.o., female MRN:  [id number] DOB:  12-Feb-1976 Patient phone:  [phone] (home)  Patient address:   [address] [place] [address],  Total Time spent with patient: 30 minutes  Date of Admission:  06/22/2015 Date of Discharge: 06/27/2015   Reason for Admission:  overdose  Principal Problem: Major depressive disorder, recurrent, severe without psychotic features [facility]) Discharge Diagnoses: Patient Active Problem List   Diagnosis Date Noted  . Marijuana abuse [F12.10]   . Major depressive disorder, recurrent, severe without psychotic features (HCC) [F33.2] 06/22/2015  . Depression [F32.9] 06/12/2015  . Active labor at term [O80] 11/30/2014  . Supervision of normal pregnancy [Z34.90] 04/03/2014  . Advanced maternal age in multigravida [O09.529] 04/03/2014  . Low back pain [M54.5] 12/26/2012  . Hyperlipidemia [E78.5] 08/26/2010  . Anxiety [F41.9] 08/26/2010  . History of acne [Z87.2]   . Keratosis pilaris [Q82.9]   . Personal history of tobacco use [Z87.891]   . TMJ PAIN [M26.629] 03/13/2009  . PREMENSTRUAL DYSPHORIC SYNDROME [N94.3] 07/25/2008  . MENORRHAGIA [N92.0] 07/14/2007  . MIGRAINES, HX OF [Z87.898] 10/08/2006    Past Psychiatric History:  See above noted  Past Medical History:  Past Medical History  Diagnosis Date  . History of acne     s/p tx with accutane  . Migraines   . PMDD (premenstrual dysphoric disorder)   . Keratosis pilaris   . Personal history of tobacco use   . Depression   . Anxiety     Past Surgical History  Procedure Laterality Date  . Wisdom tooth extraction     Family History:  Family History  Problem Relation Age of Onset  . Colon polyps Mother   . Colitis Mother   . Cancer      aunt-lymphoma   Family Psychiatric  History:  Denied Social History:  History  Alcohol Use  . 0.0 oz/week  . 0 Standard drinks or equivalent per week    Comment: occasionally     History  Drug Use No    Social History   Social History  . Marital Status: Married    Spouse Name: N/A  . Number of Children: 1  . Years of Education: N/A   Occupational History  . Insurance    Social History Main Topics  . Smoking status: Former Smoker    Types: Cigarettes  . Smokeless tobacco: None  . Alcohol Use: 0.0 oz/week    0 Standard drinks or equivalent per week     Comment: occasionally  . Drug Use: No  . Sexual Activity: Yes    Birth Control/ Protection: None   Other Topics Concern  . None   Social History Narrative    Hospital Course:  Manjot Beumer is an 40 y.o. female presenting to [facility] after an intentional overdose.  Rahaf Carbonell was admitted for Major depressive disorder, recurrent, severe without psychotic features (HCC) and crisis management.  She was treated with the following medications listed below.  Myleigh Amara was discharged with current medication and was instructed on how to take medications as prescribed; (details listed below under Medication List).  Medical problems were identified and treated as needed.  Home medications were restarted as appropriate.  Improvement was monitored by observation and Toy Cookey daily report of symptom reduction.  Emotional and mental status was monitored by daily self-inventory reports completed by Toy Cookey and clinical staff.         Toy Cookey  was evaluated by the treatment team for stability and plans for continued recovery upon discharge.  Dakiya Puopolo motivation was an integral factor for scheduling further treatment.  Employment, transportation, bed availability, health status, family support, and any pending legal issues were also considered during her hospital stay.  She was offered further treatment options upon discharge including but not limited to Residential, Intensive Outpatient, and Outpatient treatment.  Shawna Wearing will follow up with the services as listed below under Follow Up  Information.     Upon completion of this admission the Kenika Sahm was both mentally and medically stable for discharge denying suicidal/homicidal ideation, auditory/visual/tactile hallucinations, delusional thoughts and paranoia.     Physical Findings: AIMS: Facial and Oral Movements Muscles of Facial Expression: None, normal Lips and Perioral Area: None, normal Jaw: None, normal Tongue: None, normal,Extremity Movements Upper (arms, wrists, hands, fingers): None, normal Lower (legs, knees, ankles, toes): None, normal, Trunk Movements Neck, shoulders, hips: None, normal, Overall Severity Severity of abnormal movements (highest score from questions above): None, normal Incapacitation due to abnormal movements: None, normal Patient's awareness of abnormal movements (rate only patient's report): No Awareness, Dental Status Current problems with teeth and/or dentures?: No Does patient usually wear dentures?: No  CIWA:    COWS:     Musculoskeletal: Strength & Muscle Tone: within normal limits Gait & Station: normal Patient leans: N/A  Psychiatric Specialty Exam:  SEE MD SRA Review of Systems  All other systems reviewed and are negative.   Blood pressure 111/79, pulse 118, temperature 98.6 F (37 C), temperature source Oral, resp. rate 16, height  (1.626 m), weight 72.576 kg (160 lb), not currently breastfeeding.Body mass index is 27.45 kg/(m^2).  Have you used any form of tobacco in the last 30 days? (Cigarettes, Smokeless Tobacco, Cigars, and/or Pipes): No  Has this patient used any form of tobacco in the last 30 days? (Cigarettes, Smokeless Tobacco, Cigars, and/or Pipes) Yes, No  Blood Alcohol level:  Lab Results  Component Value Date   ETH <5 06/21/2015    Metabolic Disorder Labs:  No results found for: HGBA1C, MPG No results found for: PROLACTIN Lab Results  Component Value Date   CHOL 161 12/26/2012   TRIG 310.0* 12/26/2012   HDL 34.80* 12/26/2012   CHOLHDL 5  12/26/2012   VLDL 62.0* 12/26/2012   LDLCALC 120* 06/30/2011    See Psychiatric Specialty Exam and Suicide Risk Assessment completed by Attending Physician prior to discharge.  Discharge destination:  Home  Is patient on multiple antipsychotic therapies at discharge:  No   Has Patient had three or more failed trials of antipsychotic monotherapy by history:  No  Recommended Plan for Multiple Antipsychotic Therapies: NA     Medication List    STOP taking these medications        ibuprofen 200 MG tablet  Commonly known as:  ADVIL,MOTRIN      TAKE these medications      Indication   ARIPiprazole 2 MG tablet  Commonly known as:  ABILIFY  Take 1 tablet (2 mg total) by mouth at bedtime.   Indication:  Major Depressive Disorder     buPROPion 150 MG 24 hr tablet  Commonly known as:  WELLBUTRIN XL  Take 1 tablet (150 mg total) by mouth daily.   Indication:  Major Depressive Disorder     busPIRone 10 MG tablet  Commonly known as:  BUSPAR  Take 1 tablet (10 mg total) by mouth 2 (two) times daily.  Indication:  Depression     traZODone 50 MG tablet  Commonly known as:  DESYREL  Take 1 tablet (50 mg total) by mouth at bedtime as needed for sleep.   Indication:  Trouble Sleeping           Follow-up Information    Follow up with [facility] On 07/15/2015.   Why:  at 10:00am for medication management with Dr. Salvatore Decent information:   [address] [place], [place] [address] [phone]      Follow up with Pt plans to schedule therapy with husband's EAP .      Follow-up recommendations:  Activity:  as tol Diet:  as tol  Comments:  1.  Take all your medications as prescribed.              2.  Report any adverse side effects to outpatient provider.                       3.  Patient instructed to not use alcohol or illegal drugs while on prescription medicines.            4.  In the event of worsening symptoms,  instructed patient to call 911, the crisis hotline or go to nearest emergency room for evaluation of symptoms.  Signed: Lindwood Qua, NP [facility] 06/27/2015, 1:37 PM  Patient seen, Suicide Assessment Completed.  Disposition Plan Reviewed

## 2015-06-27 NOTE — [facility] Suicide Risk Assessment (Signed)
[facility] Discharge Suicide Risk Assessment   Principal Problem: Major depressive disorder, recurrent, severe without psychotic features [facility]) Discharge Diagnoses:  Patient Active Problem List   Diagnosis Date Noted  . Marijuana abuse [F12.10]   . Major depressive disorder, recurrent, severe without psychotic features (HCC) [F33.2] 06/22/2015  . Depression [F32.9] 06/12/2015  . Active labor at term [O80] 11/30/2014  . Supervision of normal pregnancy [Z34.90] 04/03/2014  . Advanced maternal age in multigravida [O09.529] 04/03/2014  . Low back pain [M54.5] 12/26/2012  . Hyperlipidemia [E78.5] 08/26/2010  . Anxiety [F41.9] 08/26/2010  . History of acne [Z87.2]   . Keratosis pilaris [Q82.9]   . Personal history of tobacco use [Z87.891]   . TMJ PAIN [M26.629] 03/13/2009  . PREMENSTRUAL DYSPHORIC SYNDROME [N94.3] 07/25/2008  . MENORRHAGIA [N92.0] 07/14/2007  . MIGRAINES, HX OF [Z87.898] 10/08/2006    Total Time spent with patient: 30 minutes  Musculoskeletal: Strength & Muscle Tone: within normal limits Gait & Station: normal Patient leans: N/A  Psychiatric Specialty Exam: ROS  Blood pressure 111/79, pulse 118, temperature 98.6 F (37 C), temperature source Oral, resp. rate 16, height  (1.626 m), weight 160 lb (72.576 kg), not currently breastfeeding.Body mass index is 27.45 kg/(m^2).  General Appearance: Well Groomed  Eye Contact::  Good  Speech:  Normal Rate409  Volume:  Normal  Mood:  improved mood, does not feel depressed today  Affect:  appropriate, more reactive   Thought Process:  Linear  Orientation:  Full (Time, Place, and Person)  Thought Content:  denies hallucinations, no delusions   Suicidal Thoughts:  No  Denies any suicidal or self injurious ideations   Homicidal Thoughts:  No specifically also denies any violent or homicidal ideations towards any family or her child   Memory:  recent and remote grossly intact   Judgement:  Other:  improved   Insight:  Present   Psychomotor Activity:  Normal  Concentration:  Good  Recall:  Good  Fund of Knowledge:Good  Language: Good  Akathisia:  Negative  Handed:  Right  AIMS (if indicated):     Assets:  Desire for Improvement Physical Health Resilience Social Support  Sleep:  Number of Hours: 6  Cognition: WNL  ADL's:  Intact   Mental Status Per Nursing Assessment::   On Admission:     Demographic Factors:  40 year old married female, has 26 month old child   Loss Factors: Recent argument with husband, had a child 7 months ago. Historical Factors: History of depression, history of anxiety, history of poor response to Paxil  Risk Reduction Factors:   Responsible for children under 54 years of age, Sense of responsibility to family, Living with another person, especially a relative, Positive social support and Positive coping skills or problem solving skills  Continued Clinical Symptoms:  At this time patient improved, presents alert, attentive, well related, pleasant, mood is "OK", denies depression at this time, affect more reactive, no thought disorder, no SI or HI, no psychotic symptoms , future oriented. We had family meeting with husband/ patient  prior to discharge-  Reviewed progress, disposition planning, medications.Her husband corroborates patient much improved and in agreement with discharge.  We have reviewed medication side effects, rationale for treatment   Cognitive Features That Contribute To Risk:  No gross cognitive deficits noted upon discharge. Is alert , attentive, and oriented x 3     Suicide Risk:  Mild:  Suicidal ideation of limited frequency, intensity, duration, and specificity.  There are no identifiable plans,  no associated intent, mild dysphoria and related symptoms, good self-control (both objective and subjective assessment), few other risk factors, and identifiable protective factors, including available and accessible social support.  Follow-up Information     Follow up with [facility] On 07/15/2015.   Why:  at 10:00am for medication management with Dr. Salvatore Decent information:   [address] [place], [place] [address] [phone]      Follow up with Pt plans to schedule therapy with husband's EAP .      Plan Of Care/Follow-up recommendations:  Activity:  as tolerated Diet:  Regular Tests:  NA Other:  See below  Patient is leaving unit in good spirits. Plans to return home. Follow up as above  Nehemiah Massed, MD 06/27/2015, 10:44 AM

## 2015-06-27 NOTE — Progress Notes (Signed)
Adult Psychoeducational Group Note  Date:  06/27/2015 Time:  11:42 AM  Group Topic/Focus:  Leisure and Lifestyle changes  Participation Level:  Active  Participation Quality:  Appropriate, Attentive, Sharing and Supportive  Affect:  Appropriate  Cognitive:  Alert and Appropriate  Insight: Appropriate and Good  Engagement in Group:  Supportive  Modes of Intervention:  Activity, Discussion, Education, Exploration and Problem-solving  Additional Comments:  Pt was engaged and supportive to others in group.  Karren Burly 06/27/2015, 11:42 AM

## 2015-06-27 NOTE — Tx Team (Signed)
Interdisciplinary Treatment Plan Update (Adult) Date: 06/27/2015   Date: 06/27/2015 9:31 AM  Progress in Treatment:  Attending groups: Yes  Participating in groups: Yes  Taking medication as prescribed: Yes  Tolerating medication: Yes  Family/Significant othe contact made: Yes with husband Patient understands diagnosis: Yes AEB seeking help with depression Discussing patient identified problems/goals with staff: Yes  Medical problems stabilized or resolved: Yes  Denies suicidal/homicidal ideation: Yes Patient has not harmed self or Others: Yes   New problem(s) identified: None identified at this time.   Discharge Plan or Barriers: Pt will return home and follow-up with outpatient resources  Additional comments:  Patient and CSW reviewed pt's identified goals and treatment plan. Patient verbalized understanding and agreed to treatment plan. CSW reviewed [facility] "Discharge Process and Patient Involvement" Form. Pt verbalized understanding of information provided and signed form.   Reason for Continuation of Hospitalization:  Anxiety Depression Medication stabilization Suicidal ideation  Estimated length of stay: 0 days  Review of initial/current patient goals per problem list:   1.  Goal(s): Patient will participate in aftercare plan  Met:  Yes  Target date: 3-5 days from date of admission   As evidenced by: Patient will participate within aftercare plan AEB aftercare provider and housing plan at discharge being identified.   06/25/15: Pt will return home and follow-up with outpatient resources  2.  Goal (s): Patient will exhibit decreased depressive symptoms and suicidal ideations.  Met:  Adequate for Dc  Target date: 3-5 days from date of admission   As evidenced by: Patient will utilize self rating of depression at 3 or below and demonstrate decreased signs of depression or be deemed stable for discharge by MD.  06/25/15: Pt rates depression at 8/10 and has tearful affect  at times. Pt denies SI 06/27/2015: MD feels that Pt's symptoms have decreased to the point that they can be managed in an outpatient setting.  3.  Goal(s): Patient will demonstrate decreased signs and symptoms of anxiety.  Met:  Adequate for DC  Target date: 3-5 days from date of admission   As evidenced by: Patient will utilize self rating of anxiety at 3 or below and demonstrated decreased signs of anxiety, or be deemed stable for discharge by MD  06/25/15: Pt rates anxiety at 10/10.  06/27/2015: MD feels that Pt's symptoms have decreased to the point that they can be managed in an outpatient setting.   Attendees:  Patient:    Family:    Physician: Dr. Parke Poisson, MD  06/27/2015 9:31 AM  Nursing: Lars Pinks, RN Case manager  06/27/2015 9:31 AM  Clinical Social Worker Peri Maris, Latanya Presser 06/27/2015 9:31 AM  Other: Tilden Fossa, [place] 06/27/2015 9:31 AM  Clinical:Sheila Main, RN; Marcella Dubs, RN  06/27/2015 9:31 AM  Other: , RN Charge Nurse 06/27/2015 9:31 AM  Other: Hilda Lias, [place], [place] Social Work [phone]

## 2015-07-15 ENCOUNTER — Telehealth (HOSPITAL_COMMUNITY): Payer: Self-pay

## 2015-07-15 ENCOUNTER — Ambulatory Visit: Payer: Self-pay | Admitting: Psychiatry

## 2015-07-15 DIAGNOSIS — F330 Major depressive disorder, recurrent, mild: Secondary | ICD-10-CM

## 2015-07-15 MED ORDER — ARIPIPRAZOLE 2 MG PO TABS: 2.0000 mg | ORAL_TABLET | Freq: Every day | ORAL | Status: DC

## 2015-07-15 MED ORDER — BUSPIRONE HCL 10 MG PO TABS: 10.0000 mg | ORAL_TABLET | Freq: Two times a day (BID) | ORAL | Status: DC

## 2015-07-15 MED ORDER — BUPROPION HCL ER (XL) 150 MG PO TB24: 150.0000 mg | ORAL_TABLET | Freq: Every day | ORAL | Status: DC

## 2015-07-15 MED ORDER — TRAZODONE HCL 50 MG PO TABS: 50.0000 mg | ORAL_TABLET | Freq: Every evening | ORAL | Status: DC | PRN

## 2015-07-15 NOTE — Telephone Encounter (Signed)
This CMA received a call from Dr. Jama Flavorsobos regarding this patients prescriptions. He asked me to please call the patient to see how she is doing and to call in 15 days with 1 refill of her medication if she is due. I called patient and she explained that she got her appointment time wrong for her appointment this morning and had been rescheduled for 4/5. She states the medication seems to be working fine, she is eating and sleeping fine. She was very upset about a call her husband had made upstairs to speak with Dr. Jama Flavorsobos, she stated that a woman named [place] answered the phone and that she laughed when the husband asked for a refill and again when he asked to speak to Dr. Jama Flavorsobos. I confirmed the pharmacy with her and these four prescriptions were sent in electronically. I advised patient to call me if she has any problems.

## 2015-08-07 ENCOUNTER — Ambulatory Visit (INDEPENDENT_AMBULATORY_CARE_PROVIDER_SITE_OTHER): Payer: 59 | Admitting: Psychiatry

## 2015-08-07 ENCOUNTER — Encounter: Payer: Self-pay | Admitting: Psychiatry

## 2015-08-07 VITALS — BP 100/62 | HR 73 | Temp 98.2°F | Ht 64.0 in | Wt 159.2 lb

## 2015-08-07 DIAGNOSIS — F331 Major depressive disorder, recurrent, moderate: Secondary | ICD-10-CM | POA: Diagnosis not present

## 2015-08-07 DIAGNOSIS — F330 Major depressive disorder, recurrent, mild: Secondary | ICD-10-CM

## 2015-08-07 MED ORDER — BUSPIRONE HCL 10 MG PO TABS: 10.0000 mg | ORAL_TABLET | Freq: Two times a day (BID) | ORAL | Status: DC

## 2015-08-07 MED ORDER — ARIPIPRAZOLE 2 MG PO TABS: 2.0000 mg | ORAL_TABLET | Freq: Every day | ORAL | Status: DC

## 2015-08-07 MED ORDER — TRAZODONE HCL 50 MG PO TABS: 50.0000 mg | ORAL_TABLET | Freq: Every evening | ORAL | Status: DC | PRN

## 2015-08-07 MED ORDER — BUPROPION HCL ER (XL) 150 MG PO TB24: 150.0000 mg | ORAL_TABLET | Freq: Every day | ORAL | Status: DC
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
## 2015-09-06 ENCOUNTER — Ambulatory Visit (INDEPENDENT_AMBULATORY_CARE_PROVIDER_SITE_OTHER): Payer: 59 | Admitting: Psychiatry

## 2015-09-06 DIAGNOSIS — F331 Major depressive disorder, recurrent, moderate: Secondary | ICD-10-CM

## 2015-09-06 DIAGNOSIS — F330 Major depressive disorder, recurrent, mild: Secondary | ICD-10-CM | POA: Diagnosis not present

## 2015-09-06 MED ORDER — ARIPIPRAZOLE 2 MG PO TABS: 2.0000 mg | ORAL_TABLET | Freq: Every day | ORAL | Status: DC

## 2015-09-06 MED ORDER — CLONAZEPAM 0.5 MG PO TABS: 0.5000 mg | ORAL_TABLET | Freq: Every day | ORAL | Status: DC | PRN

## 2015-09-06 MED ORDER — TRAZODONE HCL 50 MG PO TABS: 50.0000 mg | ORAL_TABLET | Freq: Every evening | ORAL | Status: DC | PRN

## 2015-09-06 MED ORDER — BUPROPION HCL ER (XL) 150 MG PO TB24: 150.0000 mg | ORAL_TABLET | Freq: Every day | ORAL | Status: DC
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
## 2015-10-07 ENCOUNTER — Encounter: Payer: Self-pay | Admitting: Psychiatry

## 2015-10-07 ENCOUNTER — Ambulatory Visit (INDEPENDENT_AMBULATORY_CARE_PROVIDER_SITE_OTHER): Payer: 59 | Admitting: Psychiatry

## 2015-10-07 VITALS — BP 118/70 | HR 82 | Temp 98.9°F | Ht 64.0 in | Wt 159.2 lb

## 2015-10-07 DIAGNOSIS — F331 Major depressive disorder, recurrent, moderate: Secondary | ICD-10-CM

## 2015-10-07 DIAGNOSIS — F330 Major depressive disorder, recurrent, mild: Secondary | ICD-10-CM | POA: Diagnosis not present

## 2015-10-07 MED ORDER — TRAZODONE HCL 50 MG PO TABS: 50.0000 mg | ORAL_TABLET | Freq: Every evening | ORAL | Status: DC | PRN

## 2015-10-07 MED ORDER — CLONAZEPAM 0.5 MG PO TABS: 0.5000 mg | ORAL_TABLET | Freq: Every day | ORAL | Status: DC | PRN

## 2015-10-07 MED ORDER — BUPROPION HCL ER (XL) 150 MG PO TB24: 150.0000 mg | ORAL_TABLET | Freq: Every day | ORAL | Status: DC

## 2015-10-07 MED ORDER — BREXPIPRAZOLE 0.5 MG PO TABS: 0.5000 mg | ORAL_TABLET | Freq: Every day | ORAL | Status: DC
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
## 2015-11-06 ENCOUNTER — Other Ambulatory Visit: Payer: Self-pay

## 2015-11-06 NOTE — Telephone Encounter (Signed)
pt will not have enough until her appt 11-18-15

## 2015-11-07 NOTE — Telephone Encounter (Signed)
Dr. Daleen Boravi does not feel comforable refilling medication

## 2015-11-07 NOTE — Telephone Encounter (Signed)
Patient will have to see Dr. Garnetta BuddyFaheem for this. Looking back at her previous prescriptions she has been given 15 tablets per month. I will not be able to prescribe this medication for her.

## 2015-11-11 NOTE — Telephone Encounter (Signed)
pt called spoke with lea, pt has been out of medication since friday.

## 2015-11-14 NOTE — Telephone Encounter (Signed)
pt called again today states she been out of her medication and is having panic attack.  pt was crying over the phone.

## 2015-11-18 ENCOUNTER — Ambulatory Visit: Payer: Managed Care, Other (non HMO) | Admitting: Psychiatry

## 2015-11-20 NOTE — Telephone Encounter (Signed)
Pt did not show for her appt. She kept calling for refilling of meds.

## 2015-12-09 ENCOUNTER — Telehealth: Payer: Self-pay

## 2015-12-09 MED ORDER — CLONAZEPAM 0.5 MG PO TABS: 0.5000 mg | ORAL_TABLET | Freq: Two times a day (BID) | ORAL | 0 refills | Status: DC | PRN

## 2015-12-09 NOTE — Telephone Encounter (Signed)
Husband said she stopped all of her psychiatric meds a month ago and has an appt with a new psychiatrist on Friday She is depressed but not suicidal  She needs something prn for anxiety until she is seen  Has taken klonopin in the past He will dose this and keep control of it  Please call it in ,thanks [facility]- [place] [facility]  They will update me if any changes  He is aware to take her to the ED if needed

## 2015-12-09 NOTE — Telephone Encounter (Signed)
Laura Hamilton (DPR signed) wants to speak with Dr Milinda Antisower about behaviorial issue. NO SI/HI. Laura Hamilton request cb when Dr Milinda Antisower has time. Laura Hamilton did not want to give me more information.

## 2015-12-09 NOTE — Telephone Encounter (Signed)
Medication phoned to pharmacy.  

## 2015-12-10 NOTE — Telephone Encounter (Signed)
Ryan checking on status of Klonopin refill; [facility] will be ready for pick up in 15-20 mins. Ryan voiced understanding.

## 2016-05-05 DIAGNOSIS — F5105 Insomnia due to other mental disorder: Secondary | ICD-10-CM | POA: Diagnosis not present

## 2016-05-05 DIAGNOSIS — F39 Unspecified mood [affective] disorder: Secondary | ICD-10-CM | POA: Diagnosis not present

## 2016-05-05 DIAGNOSIS — F419 Anxiety disorder, unspecified: Secondary | ICD-10-CM | POA: Diagnosis not present

## 2016-07-25 IMAGING — US US OB DETAIL+14 WK
2 series · 12 of 28 positions shown · non-contrast
Comparison: none

[Series 1: us ob detail +14 wk · 9 of 64 slices shown (1 of 2)]
[im 4/64]
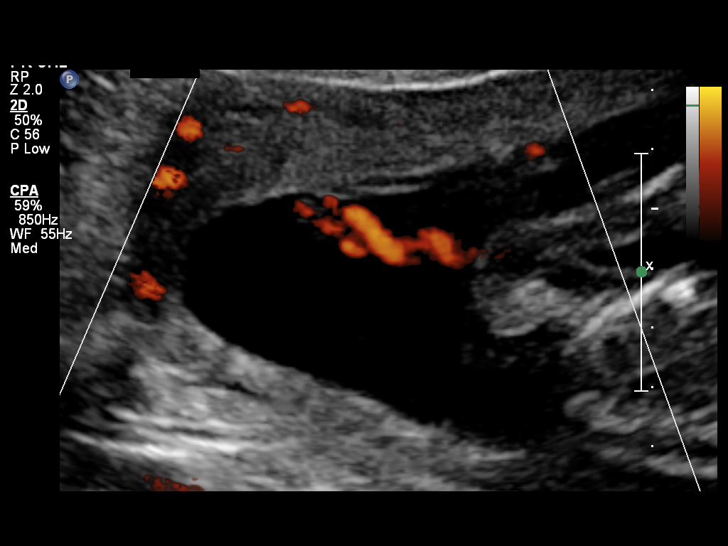
[im 10/64]
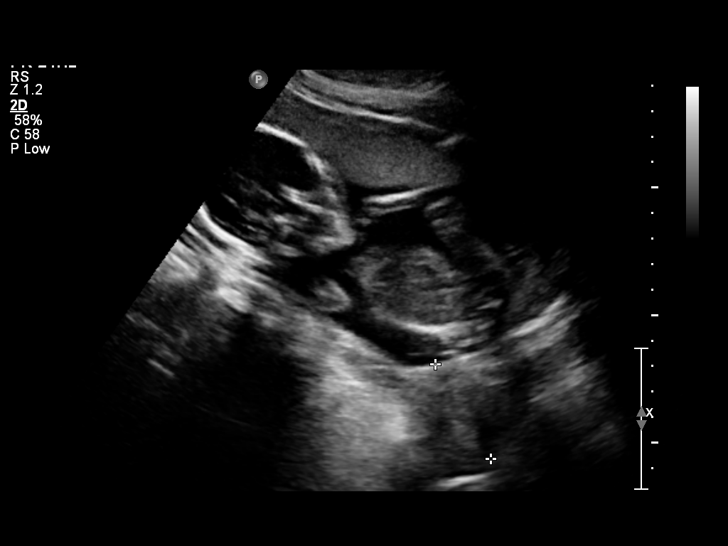
[im 16/64]
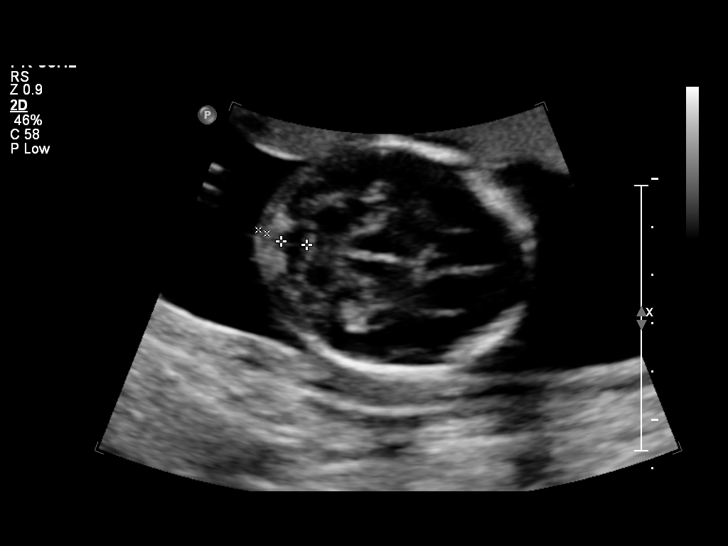
[im 25/64]
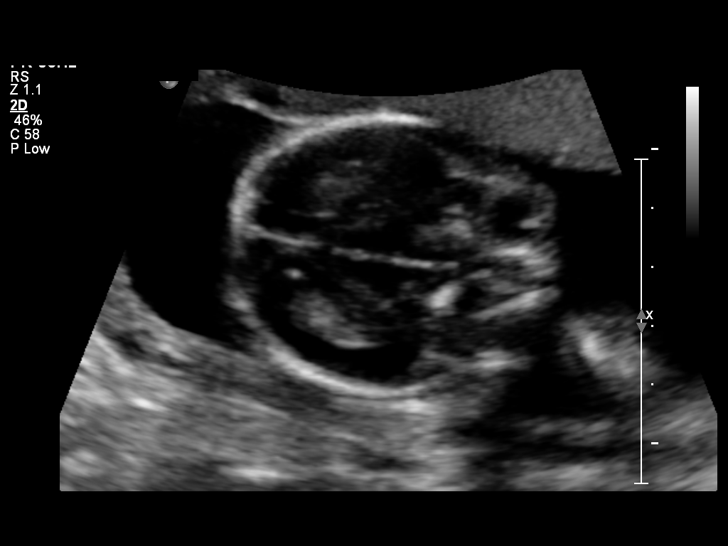
[im 31/64]
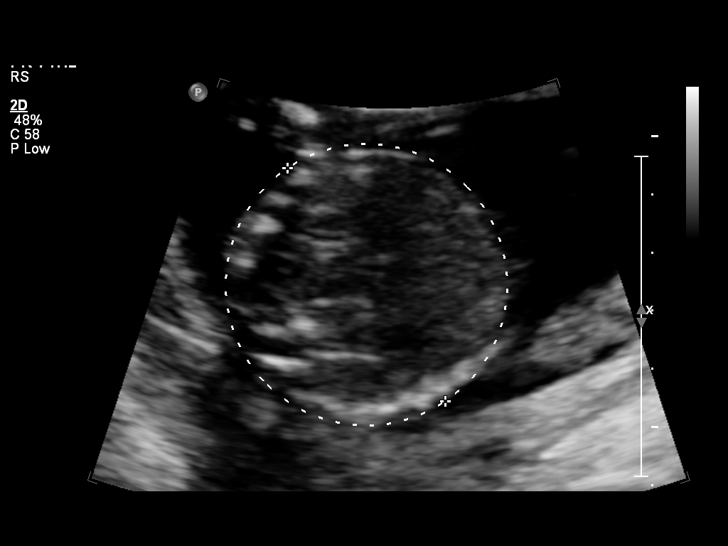
[im 37/64]
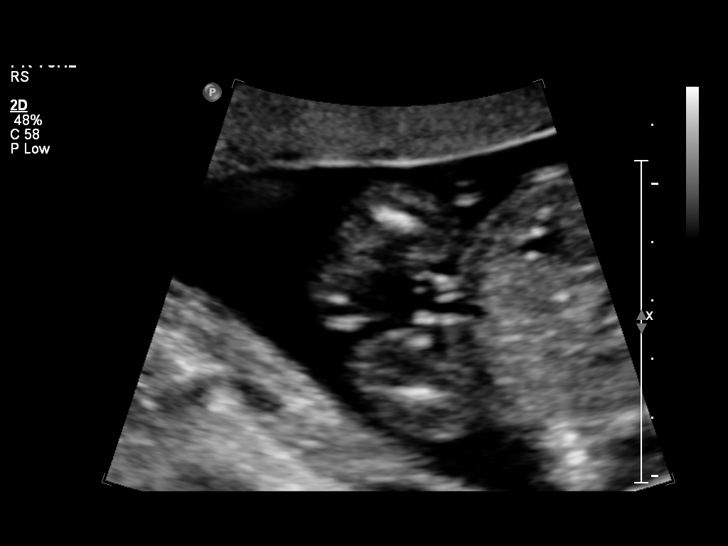
[im 46/64]
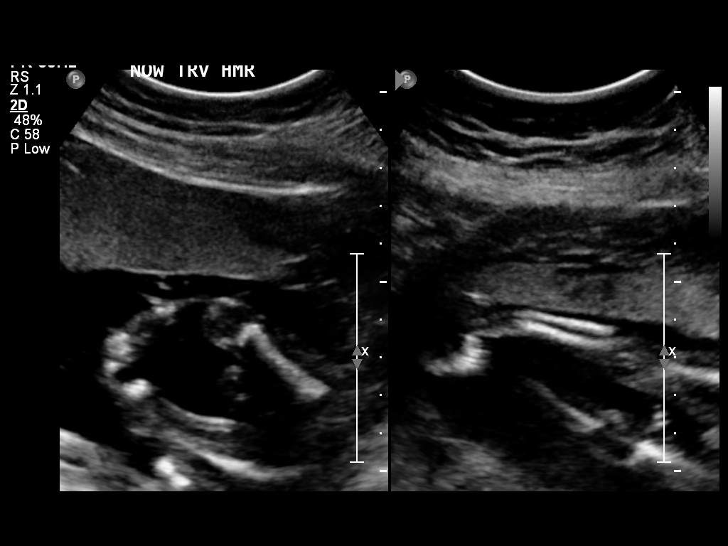
[im 52/64]
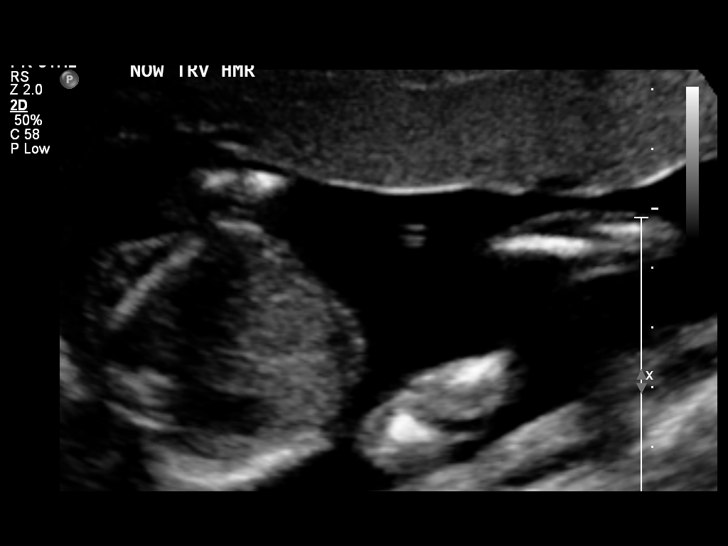
[im 58/64]
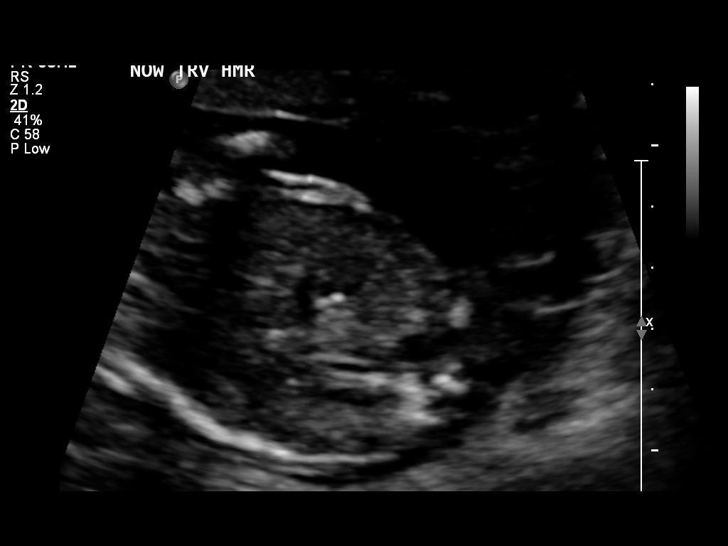

[Series 1: us ob detail +14 wk · 18 acquisitions, 3 frames shown (2 of 2)]
[im 1/18]
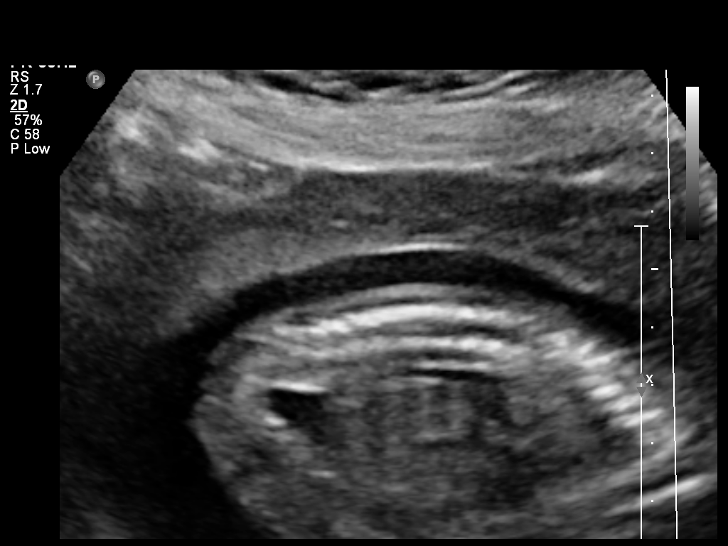
[im 7/18]
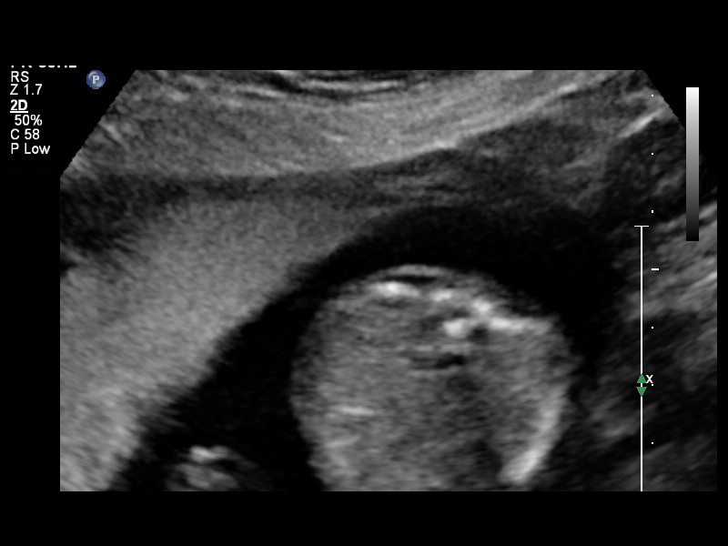
[im 14/18]
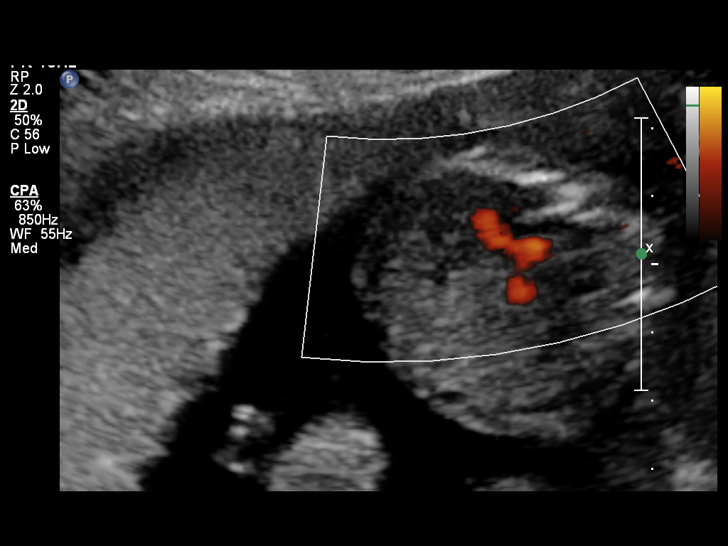

[12 of 28 positions shown; findings below may reference images not displayed]

OBSTETRICS REPORT
                      (Signed Final 07/11/2014 [DATE])

Service(s) Provided

 US OB DETAIL + 14 WK                                  76811.0
Indications

 Advanced maternal age multigravida 35+, second
 trimester
 19 weeks gestation of pregnancy
Fetal Evaluation

 Num Of Fetuses:    1
 Fetal Heart Rate:  135                          bpm
 Cardiac Activity:  Observed
 Presentation:      Variable
 Placenta:          Anterior, above cervical os
 P. Cord            Visualized, central
 Insertion:

 Amniotic Fluid
 AFI FV:      Subjectively within normal limits
                                             Larg Pckt:     4.6  cm
Biometry

 BPD:       47  mm     G. Age:  20w 1d                CI:        75.91   70 - 86
                                                      FL/HC:      17.8   16.1 -

 HC:       171  mm     G. Age:  19w 5d       75  %    HC/AC:      1.17   1.09 -

 AC:       146  mm     G. Age:  19w 6d       75  %    FL/BPD:
 FL:      30.5  mm     G. Age:  19w 3d       59  %    FL/AC:      20.9   20 - 24
 HUM:     29.1  mm     G. Age:  19w 3d       62  %
 Est. FW:     308  gm    0 lb 11 oz      56  %
Gestational Age

 LMP:           19w 0d        Date:  02/28/14                 EDD:   12/05/14
 U/S Today:     19w 5d                                        EDD:   11/30/14
 Best:          19w 0d     Det. By:  LMP  (02/28/14)          EDD:   12/05/14
Anatomy

 Cranium:          Appears normal         Aortic Arch:      Not well visualized
 Fetal Cavum:      Appears normal         Ductal Arch:      Not well visualized
 Ventricles:       Appears normal         Diaphragm:        Appears normal
 Choroid Plexus:   Appears normal         Stomach:          Appears normal, left
                                                            sided
 Cerebellum:       Appears normal         Abdomen:          Appears normal
 Posterior Fossa:  Appears normal         Abdominal Wall:   Appears nml (cord
                                                            insert, abd wall)
 Nuchal Fold:      Appears normal         Cord Vessels:     Appears normal (3
                                                            vessel cord)
 Face:             Appears normal         Kidneys:          Appear normal
                   (orbits and profile)
 Lips:             Appears normal         Bladder:          Appears normal
 Heart:            Not well visualized    Spine:            Appears normal
 RVOT:             Not well visualized    Lower             Appears normal
                                          Extremities:
 LVOT:             Not well visualized    Upper             Appears normal
                                          Extremities:

 Other:  Fetus appears to be a male. Heels visualized. 5th digit visualized.
         Nasal bone visualized. Technically difficult due to fetal position.
Cervix Uterus Adnexa

 Cervical Length:    4.28     cm

 Cervix:       Normal appearance by transabdominal scan.
Impression

 Single IUP at 19w 0d
 Limited views of the fetal heart were obtained
 The remainder of the fetal anatomy appears normal
 No markers associated with aneuploidy were appreciated
 Anterior placenta without previa
 Normal amniotic fluid volume
Recommendations

 Recommend follow-up ultrasound examination in 4-6 weeks
 to complete anatomy

 questions or concerns.

## 2016-08-25 DIAGNOSIS — F39 Unspecified mood [affective] disorder: Secondary | ICD-10-CM | POA: Diagnosis not present

## 2016-08-25 DIAGNOSIS — F5105 Insomnia due to other mental disorder: Secondary | ICD-10-CM | POA: Diagnosis not present

## 2016-08-25 DIAGNOSIS — F419 Anxiety disorder, unspecified: Secondary | ICD-10-CM | POA: Diagnosis not present

## 2016-11-19 DIAGNOSIS — H5213 Myopia, bilateral: Secondary | ICD-10-CM | POA: Diagnosis not present

## 2017-08-12 ENCOUNTER — Ambulatory Visit: Payer: Self-pay | Admitting: Family Medicine

## 2017-12-30 DIAGNOSIS — F331 Major depressive disorder, recurrent, moderate: Secondary | ICD-10-CM | POA: Diagnosis not present

## 2018-01-04 DIAGNOSIS — F331 Major depressive disorder, recurrent, moderate: Secondary | ICD-10-CM | POA: Diagnosis not present

## 2018-01-18 DIAGNOSIS — F331 Major depressive disorder, recurrent, moderate: Secondary | ICD-10-CM | POA: Diagnosis not present

## 2018-01-26 ENCOUNTER — Encounter: Payer: Self-pay | Admitting: Family Medicine

## 2018-01-26 ENCOUNTER — Ambulatory Visit (INDEPENDENT_AMBULATORY_CARE_PROVIDER_SITE_OTHER): Payer: BLUE CROSS/BLUE SHIELD | Admitting: Family Medicine

## 2018-01-26 VITALS — BP 118/68 | HR 80 | Temp 98.5°F | Ht 64.0 in | Wt 171.6 lb

## 2018-01-26 DIAGNOSIS — F419 Anxiety disorder, unspecified: Secondary | ICD-10-CM

## 2018-01-26 DIAGNOSIS — F331 Major depressive disorder, recurrent, moderate: Secondary | ICD-10-CM | POA: Diagnosis not present

## 2018-01-26 DIAGNOSIS — Z87891 Personal history of nicotine dependence: Secondary | ICD-10-CM

## 2018-01-26 DIAGNOSIS — K645 Perianal venous thrombosis: Secondary | ICD-10-CM | POA: Diagnosis not present

## 2018-01-26 DIAGNOSIS — Z23 Encounter for immunization: Secondary | ICD-10-CM | POA: Diagnosis not present

## 2018-01-26 MED ORDER — HYDROCORTISONE 2.5 % RE CREA: 1.0000 "application " | TOPICAL_CREAM | Freq: Two times a day (BID) | RECTAL | 3 refills | Status: DC

## 2018-01-26 MED ORDER — CLONAZEPAM 0.5 MG PO TABS: 0.5000 mg | ORAL_TABLET | Freq: Two times a day (BID) | ORAL | 0 refills | Status: DC | PRN

## 2018-01-26 MED ORDER — SERTRALINE HCL 50 MG PO TABS: ORAL_TABLET | ORAL | 0 refills | Status: DC

## 2018-01-26 MED ORDER — HYDROCORTISONE ACE-PRAMOXINE 1-1 % RE FOAM: 1.0000 | Freq: Two times a day (BID) | RECTAL | 2 refills | Status: DC

## 2018-01-26 NOTE — Patient Instructions (Signed)
Hemorrhoids Hemorrhoids are swollen veins in and around the rectum or anus. There are two types of hemorrhoids:  Internal hemorrhoids. These occur in the veins that are just inside the rectum. They may poke through to the outside and become irritated and painful.  External hemorrhoids. These occur in the veins that are outside of the anus and can be felt as a painful swelling or hard lump near the anus.  Most hemorrhoids do not cause serious problems, and they can be managed with home treatments such as diet and lifestyle changes. If home treatments do not help your symptoms, procedures can be done to shrink or remove the hemorrhoids. What are the causes? This condition is caused by increased pressure in the anal area. This pressure may result from various things, including:  Constipation.  Straining to have a bowel movement.  Diarrhea.  Pregnancy.  Obesity.  Sitting for long periods of time.  Heavy lifting or other activity that causes you to strain.  Anal sex.  What are the signs or symptoms? Symptoms of this condition include:  Pain.  Anal itching or irritation.  Rectal bleeding.  Leakage of stool (feces).  Anal swelling.  One or more lumps around the anus.  How is this diagnosed? This condition can often be diagnosed through a visual exam. Other exams or tests may also be done, such as:  Examination of the rectal area with a gloved hand (digital rectal exam).  Examination of the anal canal using a small tube (anoscope).  A blood test, if you have lost a significant amount of blood.  A test to look inside the colon (sigmoidoscopy or colonoscopy).  How is this treated? This condition can usually be treated at home. However, various procedures may be done if dietary changes, lifestyle changes, and other home treatments do not help your symptoms. These procedures can help make the hemorrhoids smaller or remove them completely. Some of these procedures involve  surgery, and others do not. Common procedures include:  Rubber band ligation. Rubber bands are placed at the base of the hemorrhoids to cut off the blood supply to them.  Sclerotherapy. Medicine is injected into the hemorrhoids to shrink them.  Infrared coagulation. A type of light energy is used to get rid of the hemorrhoids.  Hemorrhoidectomy surgery. The hemorrhoids are surgically removed, and the veins that supply them are tied off.  Stapled hemorrhoidopexy surgery. A circular stapling device is used to remove the hemorrhoids and use staples to cut off the blood supply to them.  Follow these instructions at home: Eating and drinking  Eat foods that have a lot of fiber in them, such as whole grains, beans, nuts, fruits, and vegetables. Ask your health care provider about taking products that have added fiber (fiber supplements).  Drink enough fluid to keep your urine clear or pale yellow. Managing pain and swelling  Take warm sitz baths for 20 minutes, 3-4 times a day to ease pain and discomfort.  If directed, apply ice to the affected area. Using ice packs between sitz baths may be helpful. ? Put ice in a plastic bag. ? Place a towel between your skin and the bag. ? Leave the ice on for 20 minutes, 2-3 times a day. General instructions  Take over-the-counter and prescription medicines only as told by your health care provider.  Use medicated creams or suppositories as told.  Exercise regularly.  Go to the bathroom when you have the urge to have a bowel movement. Do not wait.    Avoid straining to have bowel movements.  Keep the anal area dry and clean. Use wet toilet paper or moist towelettes after a bowel movement.  Do not sit on the toilet for long periods of time. This increases blood pooling and pain. Contact a health care provider if:  You have increasing pain and swelling that are not controlled by treatment or medicine.  You have uncontrolled bleeding.  You  have difficulty having a bowel movement, or you are unable to have a bowel movement.  You have pain or inflammation outside the area of the hemorrhoids. This information is not intended to replace advice given to you by your health care provider. Make sure you discuss any questions you have with your health care provider. Document Released: 04/17/2000 Document Revised: 09/18/2015 Document Reviewed: 01/02/2015 Elsevier Interactive Patient Education  2018 [employer] Sertraline tablets What is this medicine? SERTRALINE (SER tra leen) is used to treat depression. It may also be used to treat obsessive compulsive disorder, panic disorder, post-trauma stress, premenstrual dysphoric disorder (PMDD) or social anxiety. This medicine may be used for other purposes; ask your health care provider or pharmacist if you have questions. COMMON BRAND NAME(S): Zoloft What should I tell my health care provider before I take this medicine? They need to know if you have any of these conditions: -bleeding disorders -bipolar disorder or a family history of bipolar disorder -glaucoma -heart disease -high blood pressure -history of irregular heartbeat -history of low levels of calcium, magnesium, or potassium in the blood -if you often drink alcohol -liver disease -receiving electroconvulsive therapy -seizures -suicidal thoughts, plans, or attempt; a previous suicide attempt by you or a family member -take medicines that treat or prevent blood clots -thyroid disease -an unusual or allergic reaction to sertraline, other medicines, foods, dyes, or preservatives -pregnant or trying to get pregnant -breast-feeding How should I use this medicine? Take this medicine by mouth with a glass of water. Follow the directions on the prescription label. You can take it with or without food. Take your medicine at regular intervals. Do not take your medicine more often than directed. Do not stop taking this medicine  suddenly except upon the advice of your doctor. Stopping this medicine too quickly may cause serious side effects or your condition may worsen. A special MedGuide will be given to you by the pharmacist with each prescription and refill. Be sure to read this information carefully each time. Talk to your pediatrician regarding the use of this medicine in children. While this drug may be prescribed for children as young as 7 years for selected conditions, precautions do apply. Overdosage: If you think you have taken too much of this medicine contact a poison control center or emergency room at once. NOTE: This medicine is only for you. Do not share this medicine with others. What if I miss a dose? If you miss a dose, take it as soon as you can. If it is almost time for your next dose, take only that dose. Do not take double or extra doses. What may interact with this medicine? Do not take this medicine with any of the following medications: -cisapride -dofetilide -dronedarone -linezolid -MAOIs like Carbex, Eldepryl, Marplan, Nardil, and Parnate -methylene blue (injected into a vein) -pimozide -thioridazine This medicine may also interact with the following medications: -alcohol -amphetamines -aspirin and aspirin-like medicines -certain medicines for depression, anxiety, or psychotic disturbances -certain medicines for fungal infections like ketoconazole, fluconazole, posaconazole, and itraconazole -certain medicines for irregular heart beat  like flecainide, quinidine, propafenone -certain medicines for migraine headaches like almotriptan, eletriptan, frovatriptan, naratriptan, rizatriptan, sumatriptan, zolmitriptan -certain medicines for sleep -certain medicines for seizures like carbamazepine, valproic acid, phenytoin -certain medicines that treat or prevent blood clots like warfarin, enoxaparin, dalteparin -cimetidine -digoxin -diuretics -fentanyl -isoniazid -lithium -NSAIDs,  medicines for pain and inflammation, like ibuprofen or naproxen -other medicines that prolong the QT interval (cause an abnormal heart rhythm) -rasagiline -safinamide -supplements like St. John's wort, kava kava, valerian -tolbutamide -tramadol -tryptophan This list may not describe all possible interactions. Give your health care provider a list of all the medicines, herbs, non-prescription drugs, or dietary supplements you use. Also tell them if you smoke, drink alcohol, or use illegal drugs. Some items may interact with your medicine. What should I watch for while using this medicine? Tell your doctor if your symptoms do not get better or if they get worse. Visit your doctor or health care professional for regular checks on your progress. Because it may take several weeks to see the full effects of this medicine, it is important to continue your treatment as prescribed by your doctor. Patients and their families should watch out for new or worsening thoughts of suicide or depression. Also watch out for sudden changes in feelings such as feeling anxious, agitated, panicky, irritable, hostile, aggressive, impulsive, severely restless, overly excited and hyperactive, or not being able to sleep. If this happens, especially at the beginning of treatment or after a change in dose, call your health care professional. Bonita Quin may get drowsy or dizzy. Do not drive, use machinery, or do anything that needs mental alertness until you know how this medicine affects you. Do not stand or sit up quickly, especially if you are an older patient. This reduces the risk of dizzy or fainting spells. Alcohol may interfere with the effect of this medicine. Avoid alcoholic drinks. Your mouth may get dry. Chewing sugarless gum or sucking hard candy, and drinking plenty of water may help. Contact your doctor if the problem does not go away or is severe. What side effects may I notice from receiving this medicine? Side effects  that you should report to your doctor or health care professional as soon as possible: -allergic reactions like skin rash, itching or hives, swelling of the face, lips, or tongue -anxious -black, tarry stools -changes in vision -confusion -elevated mood, decreased need for sleep, racing thoughts, impulsive behavior -eye pain -fast, irregular heartbeat -feeling faint or lightheaded, falls -feeling agitated, angry, or irritable -hallucination, loss of contact with reality -loss of balance or coordination -loss of memory -painful or prolonged erections -restlessness, pacing, inability to keep still -seizures -stiff muscles -suicidal thoughts or other mood changes -trouble sleeping -unusual bleeding or bruising -unusually weak or tired -vomiting Side effects that usually do not require medical attention (report to your doctor or health care professional if they continue or are bothersome): -change in appetite or weight -change in sex drive or performance -diarrhea -increased sweating -indigestion, nausea -tremors This list may not describe all possible side effects. Call your doctor for medical advice about side effects. You may report side effects to FDA at 1-800-FDA-1088. Where should I keep my medicine? Keep out of the reach of children. Store at room temperature between 15 and 30 degrees C (59 and 86 degrees F). Throw away any unused medicine after the expiration date. NOTE: This sheet is a summary. It may not cover all possible information. If you have questions about this medicine, talk to your doctor,  pharmacist, or health care provider.  2018 Elsevier/Gold Standard (2016-04-24 14:17:49)

## 2018-01-26 NOTE — Progress Notes (Signed)
Patient: Laura Hamilton, Female    DOB: 06/29/1975, 42 y.o.   MRN: [id number] Visit Date: 01/27/2018  Today's Provider: Shirlee LatchAngela Ajay Strubel, MD   Chief Complaint  Patient presents with  . Establish Care   Subjective:  I, Presley RaddleNikki Walston, CMA, am acting as a scribe for Shirlee LatchAngela Cowan Pilar, MD.   New Patient:  Laura Hamilton is a 42 year old female who presents today to Establish Care as a new patient. Patient was previously a patient [facility].   Patient would like to discuss antidepressants today. She has not been taking any medications since 08/2016. Patient states she is seeing a therapist Philis NettleLisa Williams at [facility] in [place].   Patient was diagnosed with anxiety depression several years ago.  She was treated for MDD, GAD, PMDD by her PCP.  She has tried Paxil and Prozac in the past.  She also tried Xanax and Klonopin.  She had been stable prior to pregnancy, but her SSRIs were stopped when she became pregnant.  Her OB resumed Prozac and she did well through pregnancy.  When her son was 596 months old, her SSRIs were being switched and she felt very overwhelmed and depressed.  She had a suicide attempt where she took an overdose of Prozac and Klonopin.  [place] behavioral health [place] for SI, suicide attempt.  She was discharged on Abilify, Wellbutrin, Klonopin, and trazodone.  She states that she did not feel better on these medications, only drug.  She then saw Dr. Excell Seltzerooper for psychiatry and was started on Zoloft.  She states she did very well on Zoloft.  She stopped seeing doctors in 2018 and stopped all medication.  She states that she felt too embarrassed to discuss her suicide attempt.  She feels like her anxiety and depression have worsened recently.  She is having a lot of stress due to her son's illness.  She has come to terms with the fact that she likely needs to take a medicine and seek out therapy again.  Her therapist also  encouraged her to take medication and see a physician.  H/o migraines with aura. None in last several years. Occurred rarely. Not on any medications.  H/o acne - s/p treatment with accutane  Patient has had intermittent issues with external hemorrhoids since her last pregnancy ~3 yrs ago.  They started to hurt severely last week.  She has been using Tuck's pads, and OTC hemorrhoid cream with lidocaine -----------------------------------------------------------------  Depression screen PHQ 2/9 01/26/2018  Decreased Interest 2  Down, Depressed, Hopeless 2  PHQ - 2 Score 4  Altered sleeping 3  Tired, decreased energy 3  Change in appetite 3  Feeling bad or failure about yourself  2  Trouble concentrating 0  Moving slowly or fidgety/restless 0  Suicidal thoughts 0  PHQ-9 Score 15  Difficult doing work/chores Somewhat difficult    GAD 7 : Generalized Anxiety Score 01/26/2018  Nervous, Anxious, on Edge 3  Control/stop worrying 3  Worry too much - different things 3  Trouble relaxing 3  Restless 3  Easily annoyed or irritable 3  Afraid - awful might happen 2  Total GAD 7 Score 20  Anxiety Difficulty Very difficult      Review of Systems  Constitutional: Positive for fatigue.       Crying and irritability   HENT: Negative.   Eyes: Negative.   Respiratory: Negative.   Cardiovascular: Negative.  Gastrointestinal: Negative.   Endocrine: Positive for polyuria.  Genitourinary: Negative.   Musculoskeletal: Negative.   Skin: Negative.   Allergic/Immunologic: Negative.   Neurological: Negative.   Hematological: Negative.   Psychiatric/Behavioral: Positive for agitation and sleep disturbance. The patient is nervous/anxious.     Social History      She  reports that she quit smoking about 12 years ago. Her smoking use included cigarettes. She has a 2.50 pack-year smoking history. She has never used smokeless tobacco. She reports that she does not drink alcohol or use drugs.        Social History   Socioeconomic History  . Marital status: Married    Spouse name: Not on file  . Number of children: 2  . Years of education: Not on file  . Highest education level: Not on file  Occupational History  . Not on file  Social Needs  . Financial resource strain: Not on file  . Food insecurity:    Worry: Not on file    Inability: Not on file  . Transportation needs:    Medical: Not on file    Non-medical: Not on file  Tobacco Use  . Smoking status: Former Smoker    Packs/day: 0.25    Years: 10.00    Pack years: 2.50    Types: Cigarettes    Last attempt to quit: 2007    Years since quitting: 12.7  . Smokeless tobacco: Never Used  Substance and Sexual Activity  . Alcohol use: No    Alcohol/week: 0.0 standard drinks    Comment: occasionally  . Drug use: No  . Sexual activity: Yes    Partners: Male    Birth control/protection: None, IUD  Lifestyle  . Physical activity:    Days per week: Not on file    Minutes per session: Not on file  . Stress: Not on file  Relationships  . Social connections:    Talks on phone: Not on file    Gets together: Not on file    Attends religious service: Not on file    Active member of club or organization: Not on file    Attends meetings of clubs or organizations: Not on file    Relationship status: Not on file  Other Topics Concern  . Not on file  Social History Narrative  . Not on file    Past Medical History:  Diagnosis Date  . Anxiety   . Depression   . History of acne    s/p tx with accutane  . Keratosis pilaris   . Migraines   . Personal history of tobacco use   . PMDD (premenstrual dysphoric disorder)      Patient Active Problem List   Diagnosis Date Noted  . External hemorrhoid, thrombosed 01/27/2018  . Marijuana abuse   . Major depressive disorder, recurrent (HCC) 06/22/2015  . Hyperlipidemia 08/26/2010  . Anxiety 08/26/2010  . History of acne   . Keratosis pilaris   . Personal history of  tobacco use   . MIGRAINES, HX OF 10/08/2006    Past Surgical History:  Procedure Laterality Date  . WISDOM TOOTH EXTRACTION      Family History        Family Status  Relation Name Status  . Mother  Alive  . Father  Alive  . Sister  Alive  . Sister  Alive  . Sister  Alive  . Sister  Alive  . Unknown  (Not Specified)  . MGM  (Not  Specified)  . Neg Hx  (Not Specified)        Her family history includes Anxiety disorder in her sister and sister; Cancer in her maternal grandmother and unknown relative; Colitis in her mother; Colon polyps in her mother; Depression in her sister and sister. There is no history of Breast cancer or Colon cancer.      Allergies  Allergen Reactions  . Other Nausea Only    ALL NARCOTICS  . Paxil [Paroxetine Hcl]     Suicidal thoughts      Current Outpatient Medications:  .  clonazePAM (KLONOPIN) 0.5 MG tablet, Take 1 tablet (0.5 mg total) by mouth 2 (two) times daily as needed for anxiety., Disp: 20 tablet, Rfl: 0 .  hydrocortisone (ANUSOL-HC) 2.5 % rectal cream, Place 1 application rectally 2 (two) times daily., Disp: 30 g, Rfl: 3 .  hydrocortisone-pramoxine (PROCTOFOAM HC) rectal foam, Place 1 applicator rectally 2 (two) times daily., Disp: 10 g, Rfl: 2 .  sertraline (ZOLOFT) 50 MG tablet, Take 1 tablet (50 mg total) by mouth daily for 14 days, THEN 2 tablets (100 mg total) daily., Disp: 154 tablet, Rfl: 0   Patient Care Team: Erasmo Downer, MD as PCP - General (Family Medicine)      Objective:   Vitals: BP 118/68 (BP Location: Right Arm, Patient Position: Sitting, Cuff Size: Normal)   Pulse 80   Temp 98.5 F (36.9 C) (Oral)   Ht 5\' 4"  (1.626 m)   Wt 171 lb 9.6 oz (77.8 kg)   SpO2 99%   BMI 29.46 kg/m    Vitals:   01/26/18 1510  BP: 118/68  Pulse: 80  Temp: 98.5 F (36.9 C)  TempSrc: Oral  SpO2: 99%  Weight: 171 lb 9.6 oz (77.8 kg)  Height: 5\' 4"  (1.626 m)     Physical Exam  Constitutional: She is oriented to person,  place, and time. She appears well-developed and well-nourished. No distress.  HENT:  Head: Normocephalic and atraumatic.  Right Ear: External ear normal.  Left Ear: External ear normal.  Nose: Nose normal.  Mouth/Throat: Oropharynx is clear and moist.  Eyes: Pupils are equal, round, and reactive to light. Conjunctivae and EOM are normal. No scleral icterus.  Neck: Neck supple. No thyromegaly present.  Cardiovascular: Normal rate, regular rhythm, normal heart sounds and intact distal pulses.  No murmur heard. Pulmonary/Chest: Effort normal and breath sounds normal. No respiratory distress. She has no wheezes. She has no rales.  Abdominal: Soft. Bowel sounds are normal. She exhibits no distension. There is no tenderness. There is no rebound and no guarding.  Genitourinary: Rectal exam shows external hemorrhoid (one is thrombosed). Rectal exam shows no internal hemorrhoid, no fissure and anal tone normal.  Musculoskeletal: She exhibits no edema or deformity.  Lymphadenopathy:    She has no cervical adenopathy.  Neurological: She is alert and oriented to person, place, and time.  Skin: Skin is warm and dry. Capillary refill takes less than 2 seconds. No rash noted.  Psychiatric: Her speech is normal. Her mood appears anxious. Her affect is labile. Cognition and memory are normal. She exhibits a depressed mood. She expresses no homicidal and no suicidal ideation. She expresses no suicidal plans and no homicidal plans.  Tearful intermittently  Vitals reviewed.    Assessment & Plan:    Problem List Items Addressed This Visit      Cardiovascular and Mediastinum   External hemorrhoid, thrombosed    New problem As this is been  present for more than 2 days, I&D is unlikely to help Discussed that in the future, she should come in sooner if she gets this Will prescribe Proctofoam and Anusol suppositories that she can use as needed She can also continue Tucks pads and OTC cream as  needed Discussed constipation management and avoiding sitting on the toilet for long periods of time If continues to have issues with hemorrhoids in the future, could consider general surgery referral to discuss treatment options        Other   Personal history of tobacco use    Former smoker      Anxiety    Chronic and uncontrolled History of postpartum depression is well Many stressors that are contributing Encouraged to continue individual counseling Reviewed stressors/coping techniques/support/symptoms/treatment options and side effects in detail As she is done well on Zoloft in the past, we will resume treatment with this We will start with 50 mg daily for 2 weeks and then increase to 100 mg Discussed with patient possible side effects including GI upset, SI, sexual dysfunction Patient understands that if she develops SI, she should seek care and that we will stop the medication Small amount of Klonopin filled to use in times of panic, but discussed plan to not make this a long-term medication in hopes that it will bridge her to when her Zoloft is effective Discussed the SSRIs can take 6 to 8 weeks to reach full efficacy      Relevant Medications   sertraline (ZOLOFT) 50 MG tablet   Major depressive disorder, recurrent (HCC) - Primary    Chronic and uncontrolled Multiple stressors that are contributing History of postpartum depression As she has done well on Zoloft in the past, we will resume this Start Zoloft 50 mg daily for 2 weeks and then increase to 100 mg daily if tolerating well Discussed possible side effects including GI upset, SI, sexual dysfunction Contracted for safety-no SI currently Patient knows that if she develops SI she should seek care urgently and we may need to stop her SSRI As above, benzo use will be temporary for anxiety Encouraged to continue individual therapy and discussed synergistic effect between medications and therapy      Relevant  Medications   sertraline (ZOLOFT) 50 MG tablet    Other Visit Diagnoses    Need for influenza vaccination       Relevant Orders   Flu Vaccine QUAD 6+ mos PF IM (Fluarix Quad PF) (Completed)       Return in about 2 months (around 03/28/2018) for anxiety/depression f/u.   The entirety of the information documented in the History of Present Illness, Review of Systems and Physical Exam were personally obtained by me. Portions of this information were initially documented by Presley Raddle, CMA and reviewed by me for thoroughness and accuracy.    Erasmo Downer, MD, MPH [facility] 01/27/2018 11:12 AM

## 2018-01-27 DIAGNOSIS — F331 Major depressive disorder, recurrent, moderate: Secondary | ICD-10-CM | POA: Diagnosis not present

## 2018-01-27 DIAGNOSIS — K645 Perianal venous thrombosis: Secondary | ICD-10-CM | POA: Insufficient documentation

## 2018-01-27 NOTE — Assessment & Plan Note (Signed)
Chronic and uncontrolled History of postpartum depression is well Many stressors that are contributing Encouraged to continue individual counseling Reviewed stressors/coping techniques/support/symptoms/treatment options and side effects in detail As she is done well on Zoloft in the past, we will resume treatment with this We will start with 50 mg daily for 2 weeks and then increase to 100 mg Discussed with patient possible side effects including GI upset, SI, sexual dysfunction Patient understands that if she develops SI, she should seek care and that we will stop the medication Small amount of Klonopin filled to use in times of panic, but discussed plan to not make this a long-term medication in hopes that it will bridge her to when her Zoloft is effective Discussed the SSRIs can take 6 to 8 weeks to reach full efficacy

## 2018-01-27 NOTE — Assessment & Plan Note (Signed)
New problem As this is been present for more than 2 days, I&D is unlikely to help Discussed that in the future, she should come in sooner if she gets this Will prescribe Proctofoam and Anusol suppositories that she can use as needed She can also continue Tucks pads and OTC cream as needed Discussed constipation management and avoiding sitting on the toilet for long periods of time If continues to have issues with hemorrhoids in the future, could consider general surgery referral to discuss treatment options

## 2018-01-27 NOTE — Assessment & Plan Note (Signed)
Chronic and uncontrolled Multiple stressors that are contributing History of postpartum depression As she has done well on Zoloft in the past, we will resume this Start Zoloft 50 mg daily for 2 weeks and then increase to 100 mg daily if tolerating well Discussed possible side effects including GI upset, SI, sexual dysfunction Contracted for safety-no SI currently Patient knows that if she develops SI she should seek care urgently and we may need to stop her SSRI As above, benzo use will be temporary for anxiety Encouraged to continue individual therapy and discussed synergistic effect between medications and therapy

## 2018-01-27 NOTE — Assessment & Plan Note (Signed)
Former smoker 

## 2018-01-31 DIAGNOSIS — F331 Major depressive disorder, recurrent, moderate: Secondary | ICD-10-CM | POA: Diagnosis not present

## 2018-02-10 DIAGNOSIS — F331 Major depressive disorder, recurrent, moderate: Secondary | ICD-10-CM | POA: Diagnosis not present

## 2018-02-24 DIAGNOSIS — F331 Major depressive disorder, recurrent, moderate: Secondary | ICD-10-CM | POA: Diagnosis not present

## 2018-03-23 ENCOUNTER — Ambulatory Visit (INDEPENDENT_AMBULATORY_CARE_PROVIDER_SITE_OTHER): Payer: BLUE CROSS/BLUE SHIELD | Admitting: Family Medicine

## 2018-03-23 ENCOUNTER — Encounter: Payer: Self-pay | Admitting: Family Medicine

## 2018-03-23 VITALS — BP 118/70 | HR 89 | Temp 98.3°F | Wt 171.2 lb

## 2018-03-23 DIAGNOSIS — F331 Major depressive disorder, recurrent, moderate: Secondary | ICD-10-CM | POA: Diagnosis not present

## 2018-03-23 DIAGNOSIS — F419 Anxiety disorder, unspecified: Secondary | ICD-10-CM

## 2018-03-23 MED ORDER — SERTRALINE HCL 100 MG PO TABS: 100.0000 mg | ORAL_TABLET | Freq: Every day | ORAL | 3 refills | Status: DC

## 2018-03-23 NOTE — Progress Notes (Signed)
Patient: Laura Hamilton Female    DOB: 12-May-1975   42 y.o.   MRN: [id number] Visit Date: 03/23/2018  Today's Provider: Shirlee Latch, MD   Chief Complaint  Patient presents with  . Anxiety  . Depression   Subjective:    HPI Anxiety & Depression:  Patient presents today for a 2 month follow up. Last OV was on 01/26/2018. Patient advised to restart Zoloft at 50 mg daily for 2 weeks then increase to 100 mg daily. She reports good compliance with treatment plan. Symptoms are much improved.  Has needed Klonopin significantly less since starting Zoloft.  She still has ~1/2 of her pills left from last Rx for 20 pills.   GAD 7 : Generalized Anxiety Score 03/23/2018 01/26/2018  Nervous, Anxious, on Edge 1 3  Control/stop worrying 1 3  Worry too much - different things 1 3  Trouble relaxing 1 3  Restless 0 3  Easily annoyed or irritable 0 3  Afraid - awful might happen 0 2  Total GAD 7 Score 4 20  Anxiety Difficulty Not difficult at all Very difficult    Depression screen [facility] 2/9 03/23/2018 01/26/2018  Decreased Interest 0 2  Down, Depressed, Hopeless 1 2  PHQ - 2 Score 1 4  Altered sleeping 1 3  Tired, decreased energy 1 3  Change in appetite 0 3  Feeling bad or failure about yourself  0 2  Trouble concentrating 0 0  Moving slowly or fidgety/restless 0 0  Suicidal thoughts 0 0  PHQ-9 Score 3 15  Difficult doing work/chores Not difficult at all Somewhat difficult      Allergies  Allergen Reactions  . Other Nausea Only    ALL NARCOTICS  . Paxil [Paroxetine Hcl]     Suicidal thoughts      Current Outpatient Medications:  .  clonazePAM (KLONOPIN) 0.5 MG tablet, Take 1 tablet (0.5 mg total) by mouth 2 (two) times daily as needed for anxiety., Disp: 20 tablet, Rfl: 0 .  hydrocortisone (ANUSOL-HC) 2.5 % rectal cream, Place 1 application rectally 2 (two) times daily., Disp: 30 g, Rfl: 3 .  hydrocortisone-pramoxine (PROCTOFOAM HC) rectal foam, Place 1 applicator  rectally 2 (two) times daily., Disp: 10 g, Rfl: 2 .  sertraline (ZOLOFT) 100 MG tablet, Take 1 tablet (100 mg total) by mouth daily., Disp: 90 tablet, Rfl: 3  Review of Systems  Constitutional: Negative.   Respiratory: Negative.   Musculoskeletal: Negative.   Psychiatric/Behavioral: The patient is nervous/anxious.        Depression     Social History   Tobacco Use  . Smoking status: Former Smoker    Packs/day: 0.25    Years: 10.00    Pack years: 2.50    Types: Cigarettes    Last attempt to quit: 2007    Years since quitting: 12.8  . Smokeless tobacco: Never Used  Substance Use Topics  . Alcohol use: No    Alcohol/week: 0.0 standard drinks    Comment: occasionally   Objective:   BP 118/70 (BP Location: Right Arm, Patient Position: Sitting, Cuff Size: Normal)   Pulse 89   Temp 98.3 F (36.8 C) (Oral)   Wt 171 lb 3.2 oz (77.7 kg)   SpO2 99%   BMI 29.39 kg/m  Vitals:   03/23/18 1451  BP: 118/70  Pulse: 89  Temp: 98.3 F (36.8 C)  TempSrc: Oral  SpO2: 99%  Weight: 171 lb 3.2 oz (77.7 kg)  Physical Exam  Constitutional: She is oriented to person, place, and time. She appears well-developed and well-nourished. No distress.  HENT:  Head: Normocephalic and atraumatic.  Mouth/Throat: Oropharynx is clear and moist.  Eyes: Conjunctivae are normal. No scleral icterus.  Neck: Neck supple. No thyromegaly present.  Cardiovascular: Normal rate, regular rhythm, normal heart sounds and intact distal pulses.  No murmur heard. Pulmonary/Chest: Effort normal and breath sounds normal. No respiratory distress. She has no wheezes. She has no rales.  Musculoskeletal: She exhibits no edema.  Lymphadenopathy:    She has no cervical adenopathy.  Neurological: She is alert and oriented to person, place, and time.  Skin: Skin is warm and dry. Capillary refill takes less than 2 seconds. No rash noted.  Psychiatric: She has a normal mood and affect. Her behavior is normal.  Vitals  reviewed.       Assessment & Plan:   Problem List Items Addressed This Visit      Other   Anxiety - Primary    Chronic, significantly improved, and now well controlled She is doing well on SSRI and therapy Discussed synergistic effect and patient should continue both of these She is tolerating Zoloft well, so we will continue at 100 mg daily Discussed using Klonopin very sparingly and discussed that this will not be a long-term medication Follow-up in 3 to 6 months      Relevant Medications   sertraline (ZOLOFT) 100 MG tablet   Major depressive disorder, recurrent (HCC)    Chronic, significantly improved, and now well controlled Continue SSRI and therapy as these seem to be very helpful Continue Zoloft 100 mg daily Follow-up in 3 to 6 months      Relevant Medications   sertraline (ZOLOFT) 100 MG tablet       Return in about 2 months (around 05/23/2018) for CPE as scheduled.   The entirety of the information documented in the History of Present Illness, Review of Systems and Physical Exam were personally obtained by me. Portions of this information were initially documented by Presley RaddleNikki Walston, CMA and reviewed by me for thoroughness and accuracy.    Erasmo DownerBacigalupo, Batya Citron M, MD, MPH [facility] 03/23/2018 4:40 PM

## 2018-03-23 NOTE — Patient Instructions (Signed)

## 2018-03-23 NOTE — Assessment & Plan Note (Signed)
Chronic, significantly improved, and now well controlled Continue SSRI and therapy as these seem to be very helpful Continue Zoloft 100 mg daily Follow-up in 3 to 6 months

## 2018-03-23 NOTE — Assessment & Plan Note (Signed)
Chronic, significantly improved, and now well controlled She is doing well on SSRI and therapy Discussed synergistic effect and patient should continue both of these She is tolerating Zoloft well, so we will continue at 100 mg daily Discussed using Klonopin very sparingly and discussed that this will not be a long-term medication Follow-up in 3 to 6 months

## 2018-03-24 DIAGNOSIS — F331 Major depressive disorder, recurrent, moderate: Secondary | ICD-10-CM | POA: Diagnosis not present

## 2018-03-29 ENCOUNTER — Ambulatory Visit: Payer: Self-pay | Admitting: Family Medicine

## 2018-04-14 DIAGNOSIS — F331 Major depressive disorder, recurrent, moderate: Secondary | ICD-10-CM | POA: Diagnosis not present

## 2018-04-15 ENCOUNTER — Other Ambulatory Visit: Payer: Self-pay | Admitting: Family Medicine

## 2018-05-09 ENCOUNTER — Encounter: Payer: Self-pay | Admitting: Family Medicine

## 2018-05-09 ENCOUNTER — Other Ambulatory Visit: Payer: Self-pay | Admitting: Family Medicine

## 2018-05-09 ENCOUNTER — Ambulatory Visit (INDEPENDENT_AMBULATORY_CARE_PROVIDER_SITE_OTHER): Payer: BLUE CROSS/BLUE SHIELD | Admitting: Family Medicine

## 2018-05-09 VITALS — BP 104/70 | HR 96 | Temp 99.0°F | Resp 16 | Ht 64.0 in | Wt 172.2 lb

## 2018-05-09 DIAGNOSIS — M940 Chondrocostal junction syndrome [Tietze]: Secondary | ICD-10-CM

## 2018-05-09 DIAGNOSIS — Z124 Encounter for screening for malignant neoplasm of cervix: Secondary | ICD-10-CM

## 2018-05-09 DIAGNOSIS — E785 Hyperlipidemia, unspecified: Secondary | ICD-10-CM

## 2018-05-09 DIAGNOSIS — Z0000 Encounter for general adult medical examination without abnormal findings: Secondary | ICD-10-CM

## 2018-05-09 DIAGNOSIS — Z1239 Encounter for other screening for malignant neoplasm of breast: Secondary | ICD-10-CM

## 2018-05-09 DIAGNOSIS — E663 Overweight: Secondary | ICD-10-CM

## 2018-05-09 DIAGNOSIS — R350 Frequency of micturition: Secondary | ICD-10-CM

## 2018-05-09 LAB — POCT URINALYSIS DIPSTICK
Bilirubin, UA: NEGATIVE
Glucose, UA: NEGATIVE
Ketones, UA: NEGATIVE
Leukocytes, UA: NEGATIVE
Nitrite, UA: NEGATIVE
Protein, UA: POSITIVE — AB
Spec Grav, UA: 1.01 (ref 1.010–1.025)
Urobilinogen, UA: 1 U/dL
pH, UA: 6 (ref 5.0–8.0)

## 2018-05-09 MED ORDER — MELOXICAM 7.5 MG PO TABS: 7.5000 mg | ORAL_TABLET | Freq: Every day | ORAL | 0 refills | Status: DC

## 2018-05-09 NOTE — Progress Notes (Signed)
Patient: Laura Hamilton, Female    DOB: 06/03/1975, 43 y.o.   MRN: [id number] Visit Date: 05/09/2018  Today's Provider: Shirlee LatchAngela , MD   Chief Complaint  Patient presents with  . Annual Exam   Subjective:    Annual physical exam Laura Hamilton is a 43 y.o. female who presents today for health maintenance and complete physical. She feels fairly well. She reports she is not actively exercising. She reports she is sleeping fairly well, patient states that she takes melatonin at night.Patient reports that she gets up 3-4x a night to use restroom and states frequency of urination has got worse over the past several months. Patient would also like to address today LUQ pain for the past 4 weeks intermittent. Patient denies any known trigger to pain but states that it is sharp and reports abdominal distention. Patient denies G.I upset or symptoms of acid reflux.  She describes this as a dull pain that is worsening.  She states it now hurts more often than it does not.  The pain is along her costal margin near her epigastrium and left upper quadrant.  States it feels like it is on the outside of her stomach and is worse with certain movements.  She states bowel movements are normal, soft, nonpainful, and occur daily   Last Reported Tdap- 09/11/2014 Mammogram- Due.  She has never had a mammogram.  She does have family history of ovarian cancer. Pap- 04/03/14- Normal  -----------------------------------------------------------------   Review of Systems  Constitutional: Negative.   HENT: Negative.   Eyes: Negative.   Respiratory: Negative.   Cardiovascular: Negative.   Gastrointestinal: Positive for abdominal distention and abdominal pain.  Endocrine: Negative.   Genitourinary: Positive for frequency.  Musculoskeletal: Negative.   Skin: Negative.   Allergic/Immunologic: Negative.   Neurological: Negative.   Hematological: Negative.   Psychiatric/Behavioral: Negative.      Social History      She  reports that she quit smoking about 13 years ago. Her smoking use included cigarettes. She has a 2.50 pack-year smoking history. She has never used smokeless tobacco. She reports that she does not drink alcohol or use drugs.       Social History   Socioeconomic History  . Marital status: Married    Spouse name: Not on file  . Number of children: 2  . Years of education: Not on file  . Highest education level: Not on file  Occupational History  . Not on file  Social Needs  . Financial resource strain: Not on file  . Food insecurity:    Worry: Not on file    Inability: Not on file  . Transportation needs:    Medical: Not on file    Non-medical: Not on file  Tobacco Use  . Smoking status: Former Smoker    Packs/day: 0.25    Years: 10.00    Pack years: 2.50    Types: Cigarettes    Last attempt to quit: 2007    Years since quitting: 13.0  . Smokeless tobacco: Never Used  Substance and Sexual Activity  . Alcohol use: No    Alcohol/week: 0.0 standard drinks    Comment: occasionally  . Drug use: No  . Sexual activity: Yes    Partners: Male    Birth control/protection: None, I.U.D.  Lifestyle  . Physical activity:    Days per week: Not on file    Minutes per session: Not on file  . Stress: Not on file  Relationships  . Social connections:    Talks on phone: Not on file    Gets together: Not on file    Attends religious service: Not on file    Active member of club or organization: Not on file    Attends meetings of clubs or organizations: Not on file    Relationship status: Not on file  Other Topics Concern  . Not on file  Social History Narrative  . Not on file    Past Medical History:  Diagnosis Date  . Anxiety   . Depression   . History of acne    s/p tx with accutane  . Keratosis pilaris   . Migraines   . Personal history of tobacco use   . PMDD (premenstrual dysphoric disorder)      Patient Active Problem List    Diagnosis Date Noted  . External hemorrhoid, thrombosed 01/27/2018  . Marijuana abuse   . Major depressive disorder, recurrent (HCC) 06/22/2015  . Hyperlipidemia 08/26/2010  . Anxiety 08/26/2010  . History of acne   . Keratosis pilaris   . Personal history of tobacco use   . MIGRAINES, HX OF 10/08/2006    Past Surgical History:  Procedure Laterality Date  . WISDOM TOOTH EXTRACTION      Family History        Family Status  Relation Name Status  . Mother  Alive  . Father  Alive  . Sister  Alive  . Sister  Alive  . Sister  Alive  . Sister  Alive  . Unknown  (Not Specified)  . MGM  (Not Specified)  . Neg Hx  (Not Specified)        Her family history includes Anxiety disorder in her sister and sister; Cancer in her maternal grandmother and unknown relative; Colitis in her mother; Colon polyps in her mother; Depression in her sister and sister. There is no history of Breast cancer or Colon cancer.      Allergies  Allergen Reactions  . Other Nausea Only    ALL NARCOTICS  . Paxil [Paroxetine Hcl]     Suicidal thoughts      Current Outpatient Medications:  .  clonazePAM (KLONOPIN) 0.5 MG tablet, TAKE 1 TABLET BY MOUTH TWICE A DAY AS NEEDED FOR ANXIETY, Disp: 20 tablet, Rfl: 0 .  sertraline (ZOLOFT) 100 MG tablet, Take 1 tablet (100 mg total) by mouth daily., Disp: 90 tablet, Rfl: 3 .  hydrocortisone (ANUSOL-HC) 2.5 % rectal cream, Place 1 application rectally 2 (two) times daily. (Patient not taking: Reported on 05/09/2018), Disp: 30 g, Rfl: 3 .  hydrocortisone-pramoxine (PROCTOFOAM HC) rectal foam, Place 1 applicator rectally 2 (two) times daily. (Patient not taking: Reported on 05/09/2018), Disp: 10 g, Rfl: 2 .  meloxicam (MOBIC) 7.5 MG tablet, Take 1 tablet (7.5 mg total) by mouth daily., Disp: 30 tablet, Rfl: 0   Patient Care Team: Erasmo Downer, MD as PCP - General (Family Medicine)      Objective:   Vitals: BP 104/70   Pulse 96   Temp 99 F (37.2 C) (Oral)    Resp 16   Ht 5\' 4"  (1.626 m)   Wt 172 lb 3.2 oz (78.1 kg)   BMI 29.56 kg/m    Vitals:   05/09/18 1409  BP: 104/70  Pulse: 96  Resp: 16  Temp: 99 F (37.2 C)  TempSrc: Oral  Weight: 172 lb 3.2 oz (78.1 kg)  Height: 5\' 4"  (1.626 m)  Physical Exam Vitals signs reviewed.  Constitutional:      General: She is not in acute distress.    Appearance: Normal appearance. She is well-developed. She is not diaphoretic.  HENT:     Head: Normocephalic and atraumatic.     Right Ear: External ear normal.     Left Ear: External ear normal.     Nose: Nose normal.     Mouth/Throat:     Mouth: Mucous membranes are moist.     Pharynx: Oropharynx is clear. No oropharyngeal exudate.  Eyes:     General: No scleral icterus.    Conjunctiva/sclera: Conjunctivae normal.     Pupils: Pupils are equal, round, and reactive to light.  Neck:     Musculoskeletal: Neck supple.     Thyroid: No thyromegaly.  Cardiovascular:     Rate and Rhythm: Normal rate and regular rhythm.     Heart sounds: Normal heart sounds. No murmur.  Pulmonary:     Effort: Pulmonary effort is normal. No respiratory distress.     Breath sounds: Normal breath sounds. No wheezing or rales.  Abdominal:     General: Bowel sounds are normal. There is no distension.     Palpations: Abdomen is soft. There is no mass.     Tenderness: There is no abdominal tenderness. There is no guarding or rebound.  Genitourinary:    Comments: Breasts: breasts appear normal, no suspicious masses, no skin or nipple changes or axillary nodes.  GYN:  External genitalia within normal limits.  Vaginal mucosa pink, moist, normal rugae.  Nonfriable cervix without lesions, no discharge or bleeding noted on speculum exam.  Bimanual exam revealed normal, nongravid uterus.  No cervical motion tenderness. No adnexal masses bilaterally.    Musculoskeletal:        General: No deformity.     Right lower leg: No edema.     Left lower leg: No edema.   Lymphadenopathy:     Cervical: No cervical adenopathy.  Skin:    General: Skin is warm and dry.     Capillary Refill: Capillary refill takes less than 2 seconds.     Findings: No rash.  Neurological:     Mental Status: She is alert and oriented to person, place, and time. Mental status is at baseline.  Psychiatric:        Mood and Affect: Mood normal.        Behavior: Behavior normal.        Thought Content: Thought content normal.      Depression Screen PHQ 2/9 Scores 05/09/2018 03/23/2018 01/26/2018  PHQ - 2 Score 0 1 4  PHQ- 9 Score 4 3 15     Results for orders placed or performed in visit on 05/09/18  POCT urinalysis dipstick  Result Value Ref Range   Color, UA yellow    Clarity, UA clear    Glucose, UA Negative Negative   Bilirubin, UA negative    Ketones, UA negatie    Spec Grav, UA 1.010 1.010 - 1.025   Blood, UA non hemolyzed moderate    pH, UA 6.0 5.0 - 8.0   Protein, UA Positive (A) Negative   Urobilinogen, UA 1.0 0.2 or 1.0 E.U./dL   Nitrite, UA negative    Leukocytes, UA Negative Negative   Appearance     Odor       Assessment & Plan:     Routine Health Maintenance and Physical Exam  Exercise Activities and Dietary recommendations Goals   None  Immunization History  Administered Date(s) Administered  . Influenza,inj,Quad PF,6+ Mos 01/27/2013, 04/03/2014, 05/13/2015, 01/26/2018  . Td 09/09/2005  . Tdap 09/11/2014    Health Maintenance  Topic Date Due  . PAP SMEAR-Modifier  04/04/2015  . TETANUS/TDAP  09/10/2024  . INFLUENZA VACCINE  Completed  . HIV Screening  Completed     Discussed health benefits of physical activity, and encouraged her to engage in regular exercise appropriate for her age and condition.    --------------------------------------------------------------------  1. Encounter for annual physical exam - CBC with Differential/Platelet - Comprehensive metabolic panel - TSH - MM 3D SCREEN BREAST BILATERAL;  Future  2. Breast cancer screening - MM 3D SCREEN BREAST BILATERAL; Future  3. Urinary frequency -Somewhat chronic in nature -Discussed decreasing caffeine intake, decreasing bladder irritants, decreasing fluid intake after dinner - Nonhemolyzed trace of blood on dipstick UA, so we will send urine micro to confirm -No signs of infection on UA -Also discussed with sleep apnea can cause nocturia, so she will speak to her husband about whether or not she snores or has apnea symptoms - POCT urinalysis dipstick - Urine Microscopic  4. Hyperlipidemia, unspecified hyperlipidemia type - Lipid panel  5. Costochondritis -New problem - Abdominal exam is benign today -Given that it is along her costal margin and worse with certain movements, suspect some costochondritis - NSAID trial -Discussed return precautions  6. Screening for cervical cancer - Cytology - PAP    Meds ordered this encounter  Medications  . meloxicam (MOBIC) 7.5 MG tablet    Sig: Take 1 tablet (7.5 mg total) by mouth daily.    Dispense:  30 tablet    Refill:  0     Return in about 1 year (around 05/10/2019) for CPE.   The entirety of the information documented in the History of Present Illness, Review of Systems and Physical Exam were personally obtained by me. Portions of this information were initially documented by Presley RaddleNikki Walston and Nadene RubinsKat Wolford, CMA and reviewed by me for thoroughness and accuracy.    Erasmo DownerBacigalupo,  M, MD, MPH [facility] 05/09/2018 5:00 PM

## 2018-05-09 NOTE — Patient Instructions (Signed)
Preventive Care 40-64 Years, Female Preventive care refers to lifestyle choices and visits with your health care provider that can promote health and wellness. What does preventive care include?   A yearly physical exam. This is also called an annual well check.  Dental exams once or twice a year.  Routine eye exams. Ask your health care provider how often you should have your eyes checked.  Personal lifestyle choices, including: ? Daily care of your teeth and gums. ? Regular physical activity. ? Eating a healthy diet. ? Avoiding tobacco and drug use. ? Limiting alcohol use. ? Practicing safe sex. ? Taking low-dose aspirin daily starting at age 50. ? Taking vitamin and mineral supplements as recommended by your health care provider. What happens during an annual well check? The services and screenings done by your health care provider during your annual well check will depend on your age, overall health, lifestyle risk factors, and family history of disease. Counseling Your health care provider may ask you questions about your:  Alcohol use.  Tobacco use.  Drug use.  Emotional well-being.  Home and relationship well-being.  Sexual activity.  Eating habits.  Work and work environment.  Method of birth control.  Menstrual cycle.  Pregnancy history. Screening You may have the following tests or measurements:  Height, weight, and BMI.  Blood pressure.  Lipid and cholesterol levels. These may be checked every 5 years, or more frequently if you are over 50 years old.  Skin check.  Lung cancer screening. You may have this screening every year starting at age 55 if you have a 30-pack-year history of smoking and currently smoke or have quit within the past 15 years.  Colorectal cancer screening. All adults should have this screening starting at age 50 and continuing until age 75. Your health care provider may recommend screening at age 45. You will have tests every  1-10 years, depending on your results and the type of screening test. People at increased risk should start screening at an earlier age. Screening tests may include: ? Guaiac-based fecal occult blood testing. ? Fecal immunochemical test (FIT). ? Stool DNA test. ? Virtual colonoscopy. ? Sigmoidoscopy. During this test, a flexible tube with a tiny camera (sigmoidoscope) is used to examine your rectum and lower colon. The sigmoidoscope is inserted through your anus into your rectum and lower colon. ? Colonoscopy. During this test, a long, thin, flexible tube with a tiny camera (colonoscope) is used to examine your entire colon and rectum.  Hepatitis C blood test.  Hepatitis B blood test.  Sexually transmitted disease (STD) testing.  Diabetes screening. This is done by checking your blood sugar (glucose) after you have not eaten for a while (fasting). You may have this done every 1-3 years.  Mammogram. This may be done every 1-2 years. Talk to your health care provider about when you should start having regular mammograms. This may depend on whether you have a family history of breast cancer.  BRCA-related cancer screening. This may be done if you have a family history of breast, ovarian, tubal, or peritoneal cancers.  Pelvic exam and Pap test. This may be done every 3 years starting at age 21. Starting at age 30, this may be done every 5 years if you have a Pap test in combination with an HPV test.  Bone density scan. This is done to screen for osteoporosis. You may have this scan if you are at high risk for osteoporosis. Discuss your test results, treatment options,   and if necessary, the need for more tests with your health care provider. Vaccines Your health care provider may recommend certain vaccines, such as:  Influenza vaccine. This is recommended every year.  Tetanus, diphtheria, and acellular pertussis (Tdap, Td) vaccine. You may need a Td booster every 10 years.  Varicella  vaccine. You may need this if you have not been vaccinated.  Zoster vaccine. You may need this after age 11.  Measles, mumps, and rubella (MMR) vaccine. You may need at least one dose of MMR if you were born in 1957 or later. You may also need a second dose.  Pneumococcal 13-valent conjugate (PCV13) vaccine. You may need this if you have certain conditions and were not previously vaccinated.  Pneumococcal polysaccharide (PPSV23) vaccine. You may need one or two doses if you smoke cigarettes or if you have certain conditions.  Meningococcal vaccine. You may need this if you have certain conditions.  Hepatitis A vaccine. You may need this if you have certain conditions or if you travel or work in places where you may be exposed to hepatitis A.  Hepatitis B vaccine. You may need this if you have certain conditions or if you travel or work in places where you may be exposed to hepatitis B.  Haemophilus influenzae type b (Hib) vaccine. You may need this if you have certain conditions. Talk to your health care provider about which screenings and vaccines you need and how often you need them. This information is not intended to replace advice given to you by your health care provider. Make sure you discuss any questions you have with your health care provider. Document Released: 05/17/2015 Document Revised: 06/10/2017 Document Reviewed: 02/19/2015 Elsevier Interactive Patient Education  2019 [employer] Costochondritis  Costochondritis is swelling and irritation (inflammation) of the tissue (cartilage) that connects your ribs to your breastbone (sternum). This causes pain in the front of your chest. The pain usually starts gradually and involves more than one rib. What are the causes? The exact cause of this condition is not always known. It results from stress on the cartilage where your ribs attach to your sternum. The cause of this stress could be:  Chest injury (trauma).  Exercise or  activity, such as lifting.  Severe coughing. What increases the risk? You may be at higher risk for this condition if you:  Are female.  Are 63?42 years old.  Recently started a new exercise or work activity.  Have low levels of vitamin D.  Have a condition that makes you cough frequently. What are the signs or symptoms? The main symptom of this condition is chest pain. The pain:  Usually starts gradually and can be sharp or dull.  Gets worse with deep breathing, coughing, or exercise.  Gets better with rest.  May be worse when you press on the sternum-rib connection (tenderness). How is this diagnosed? This condition is diagnosed based on your symptoms, medical history, and a physical exam. Your health care provider will check for tenderness when pressing on your sternum. This is the most important finding. You may also have tests to rule out other causes of chest pain. These may include:  A chest X-ray to check for lung problems.  An electrocardiogram (ECG) to see if you have a heart problem that could be causing the pain.  An imaging scan to rule out a chest or rib fracture. How is this treated? This condition usually goes away on its own over time. Your health care provider may  prescribe an NSAID to reduce pain and inflammation. Your health care provider may also suggest that you:  Rest and avoid activities that make pain worse.  Apply heat or cold to the area to reduce pain and inflammation.  Do exercises to stretch your chest muscles. If these treatments do not help, your health care provider may inject a numbing medicine at the sternum-rib connection to help relieve the pain. Follow these instructions at home:  Avoid activities that make pain worse. This includes any activities that use chest, abdominal, and side muscles.  If directed, put ice on the painful area: ? Put ice in a plastic bag. ? Place a towel between your skin and the bag. ? Leave the ice on for  20 minutes, 2-3 times a day.  If directed, apply heat to the affected area as often as told by your health care provider. Use the heat source that your health care provider recommends, such as a moist heat pack or a heating pad. ? Place a towel between your skin and the heat source. ? Leave the heat on for 20-30 minutes. ? Remove the heat if your skin turns bright red. This is especially important if you are unable to feel pain, heat, or cold. You may have a greater risk of getting burned.  Take over-the-counter and prescription medicines only as told by your health care provider.  Return to your normal activities as told by your health care provider. Ask your health care provider what activities are safe for you.  Keep all follow-up visits as told by your health care provider. This is important. Contact a health care provider if:  You have chills or a fever.  Your pain does not go away or it gets worse.  You have a cough that does not go away (is persistent). Get help right away if:  You have shortness of breath. This information is not intended to replace advice given to you by your health care provider. Make sure you discuss any questions you have with your health care provider. Document Released: 01/28/2005 Document Revised: 01/20/2017 Document Reviewed: 08/14/2015 Elsevier Interactive Patient Education  [employer].

## 2018-05-10 LAB — URINALYSIS, MICROSCOPIC ONLY: Casts: NONE SEEN /lpf

## 2018-05-12 DIAGNOSIS — E785 Hyperlipidemia, unspecified: Secondary | ICD-10-CM | POA: Diagnosis not present

## 2018-05-12 DIAGNOSIS — Z0000 Encounter for general adult medical examination without abnormal findings: Secondary | ICD-10-CM | POA: Diagnosis not present

## 2018-05-13 ENCOUNTER — Telehealth: Payer: Self-pay

## 2018-05-13 LAB — CBC WITH DIFFERENTIAL/PLATELET
BASOS ABS: 0.1 10*3/uL (ref 0.0–0.2)
BASOS: 1 %
EOS (ABSOLUTE): 0.2 10*3/uL (ref 0.0–0.4)
Eos: 2 %
HEMOGLOBIN: 14.4 g/dL (ref 11.1–15.9)
Hematocrit: 41.1 % (ref 34.0–46.6)
IMMATURE GRANS (ABS): 0 10*3/uL (ref 0.0–0.1)
Immature Granulocytes: 0 %
LYMPHS: 32 %
Lymphocytes Absolute: 2.4 10*3/uL (ref 0.7–3.1)
MCH: 30.6 pg (ref 26.6–33.0)
MCHC: 35 g/dL (ref 31.5–35.7)
MCV: 87 fL (ref 79–97)
Monocytes Absolute: 0.5 10*3/uL (ref 0.1–0.9)
Monocytes: 6 %
NEUTROS ABS: 4.5 10*3/uL (ref 1.4–7.0)
NEUTROS PCT: 59 %
PLATELETS: 274 10*3/uL (ref 150–450)
RBC: 4.71 x10E6/uL (ref 3.77–5.28)
RDW: 11.8 % (ref 11.7–15.4)
WBC: 7.7 10*3/uL (ref 3.4–10.8)

## 2018-05-13 LAB — LIPID PANEL
Chol/HDL Ratio: 4.8 ratio — ABNORMAL HIGH (ref 0.0–4.4)
Cholesterol, Total: 186 mg/dL (ref 100–199)
HDL: 39 mg/dL — ABNORMAL LOW (ref >39)
LDL Calculated: 119 mg/dL — ABNORMAL HIGH (ref 0–99)
Triglycerides: 141 mg/dL (ref 0–149)
VLDL Cholesterol Cal: 28 mg/dL (ref 5–40)

## 2018-05-13 LAB — PAPLB, HPV, RFX16/18
HPV, high-risk: NEGATIVE
PAP Smear Comment: 0

## 2018-05-13 LAB — COMPREHENSIVE METABOLIC PANEL
ALK PHOS: 68 IU/L (ref 39–117)
ALT: 7 IU/L (ref 0–32)
AST: 9 IU/L (ref 0–40)
Albumin/Globulin Ratio: 1.9 (ref 1.2–2.2)
Albumin: 4.5 g/dL (ref 3.5–5.5)
BILIRUBIN TOTAL: 0.4 mg/dL (ref 0.0–1.2)
BUN/Creatinine Ratio: 15 (ref 9–23)
BUN: 9 mg/dL (ref 6–24)
CHLORIDE: 106 mmol/L (ref 96–106)
CO2: 21 mmol/L (ref 20–29)
Calcium: 9.3 mg/dL (ref 8.7–10.2)
Creatinine, Ser: 0.62 mg/dL (ref 0.57–1.00)
GFR calc Af Amer: 130 mL/min/{1.73_m2} (ref >59)
GFR calc non Af Amer: 112 mL/min/{1.73_m2} (ref >59)
GLUCOSE: 100 mg/dL — AB (ref 65–99)
Globulin, Total: 2.4 g/dL (ref 1.5–4.5)
Potassium: 4.2 mmol/L (ref 3.5–5.2)
Sodium: 142 mmol/L (ref 134–144)
TOTAL PROTEIN: 6.9 g/dL (ref 6.0–8.5)

## 2018-05-13 LAB — TSH: TSH: 1.87 u[IU]/mL (ref 0.450–4.500)

## 2018-05-13 NOTE — Telephone Encounter (Signed)
Patient has been advised. KW 

## 2018-05-13 NOTE — Telephone Encounter (Signed)
-----   Message from Erasmo Downer, MD sent at 05/13/2018  9:03 AM EST ----- Normal labs, except blood sugar is a bit high if patient was fasting for labs.  Cholesterol is slightly high, but stable.  10 year risk of heart disease/stroke is low at 0.9%.  Recommend regular exercise (30 min/day at least 5 times weekly) and diet low in saturated fat to keep this in a healthy range.  No need for medications.

## 2018-05-18 ENCOUNTER — Telehealth: Payer: Self-pay

## 2018-05-18 NOTE — Telephone Encounter (Signed)
Left patient a message advising her that results were normal.  

## 2018-05-18 NOTE — Telephone Encounter (Signed)
-----   Message from Erasmo Downer, MD sent at 05/18/2018  8:08 AM EST ----- Normal pap smear.  Repeat in 3-5 years

## 2018-05-19 DIAGNOSIS — F331 Major depressive disorder, recurrent, moderate: Secondary | ICD-10-CM | POA: Diagnosis not present

## 2018-06-23 ENCOUNTER — Telehealth: Payer: Self-pay

## 2018-06-23 ENCOUNTER — Ambulatory Visit
Admission: RE | Admit: 2018-06-23 | Discharge: 2018-06-23 | Disposition: A | Discharge status: Home / Self Care | Payer: BLUE CROSS/BLUE SHIELD | Source: Ambulatory Visit | Attending: Family Medicine | Admitting: Family Medicine

## 2018-06-23 DIAGNOSIS — Z1231 Encounter for screening mammogram for malignant neoplasm of breast: Secondary | ICD-10-CM | POA: Diagnosis not present

## 2018-06-23 DIAGNOSIS — Z1239 Encounter for other screening for malignant neoplasm of breast: Secondary | ICD-10-CM

## 2018-06-23 DIAGNOSIS — Z0000 Encounter for general adult medical examination without abnormal findings: Secondary | ICD-10-CM | POA: Diagnosis not present

## 2018-06-23 NOTE — Telephone Encounter (Signed)
-----   Message from Erasmo Downer, MD sent at 06/23/2018 11:09 AM EST ----- Normal mammogram. Repeat in 1 yr

## 2018-06-23 NOTE — Telephone Encounter (Signed)
Patient was advised.  

## 2018-07-22 ENCOUNTER — Other Ambulatory Visit: Payer: Self-pay | Admitting: Family Medicine

## 2018-12-29 ENCOUNTER — Ambulatory Visit (INDEPENDENT_AMBULATORY_CARE_PROVIDER_SITE_OTHER): Payer: BC Managed Care – PPO | Admitting: Family Medicine

## 2018-12-29 ENCOUNTER — Other Ambulatory Visit: Payer: Self-pay

## 2018-12-29 ENCOUNTER — Encounter: Payer: Self-pay | Admitting: Family Medicine

## 2018-12-29 VITALS — BP 109/75 | HR 92 | Temp 97.1°F | Wt 167.4 lb

## 2018-12-29 DIAGNOSIS — F331 Major depressive disorder, recurrent, moderate: Secondary | ICD-10-CM

## 2018-12-29 DIAGNOSIS — Z23 Encounter for immunization: Secondary | ICD-10-CM

## 2018-12-29 DIAGNOSIS — Z30431 Encounter for routine checking of intrauterine contraceptive device: Secondary | ICD-10-CM

## 2018-12-29 DIAGNOSIS — L918 Other hypertrophic disorders of the skin: Secondary | ICD-10-CM | POA: Diagnosis not present

## 2018-12-29 DIAGNOSIS — F411 Generalized anxiety disorder: Secondary | ICD-10-CM | POA: Diagnosis not present

## 2018-12-29 MED ORDER — SERTRALINE HCL 100 MG PO TABS: 150.0000 mg | ORAL_TABLET | Freq: Every day | ORAL | 3 refills | Status: DC

## 2018-12-29 MED ORDER — CLONAZEPAM 0.5 MG PO TABS: ORAL_TABLET | ORAL | 0 refills | Status: DC

## 2018-12-29 NOTE — Progress Notes (Signed)
Patient: Laura Hamilton Female    DOB: 1975-07-08   43 y.o.   MRN: [id number] Visit Date: 12/29/2018  Today's Provider: Lavon Paganini, MD   Chief Complaint  Patient presents with  . IUD Removal   Subjective:    I, Tiburcio Pea, CMA, am acting as a scribe for Lavon Paganini, MD.    HPI Anxiety & MDD: Patient would like to discuss increasing Zoloft dosage. She states she is dealing with some stress issues at the time. Her husband is a Engineer, structural and she worries a lot about his safety given current events.  She is taking Klonopin infrequently, but when husband is on night shift and she is laying in bed and thinking of all the bad things that could happen to him, she will take a dose.  IUD was placed in 2016.  She has no periods and only rare spotting.  Her PMDD symptoms are significantly improved as well. Husband has had a vasectomy 5-6 months ago.  She may want it removed due to hearing horror stories about it migrating into the abdomen.  Skin tag on L buttock. Present for years. Now irritated and painful. Gets caught on underwear line.  Wonders if she needs Derm or if it can be removed here.  Allergies  Allergen Reactions  . Other Nausea Only    ALL NARCOTICS  . Paxil [Paroxetine Hcl]     Suicidal thoughts      Current Outpatient Medications:  .  clonazePAM (KLONOPIN) 0.5 MG tablet, TAKE 1 TABLET BY MOUTH TWICE A DAY AS NEEDED FOR ANXIETY, Disp: 20 tablet, Rfl: 0 .  meloxicam (MOBIC) 7.5 MG tablet, Take 1 tablet (7.5 mg total) by mouth daily., Disp: 30 tablet, Rfl: 0 .  sertraline (ZOLOFT) 100 MG tablet, Take 1 tablet (100 mg total) by mouth daily., Disp: 90 tablet, Rfl: 3 .  hydrocortisone (ANUSOL-HC) 2.5 % rectal cream, Place 1 application rectally 2 (two) times daily. (Patient not taking: Reported on 05/09/2018), Disp: 30 g, Rfl: 3 .  hydrocortisone-pramoxine (PROCTOFOAM HC) rectal foam, Place 1 applicator rectally 2 (two) times daily. (Patient not taking:  Reported on 05/09/2018), Disp: 10 g, Rfl: 2  Review of Systems  Constitutional: Negative.   Respiratory: Negative.   Cardiovascular: Negative.   Musculoskeletal: Negative.     Social History   Tobacco Use  . Smoking status: Former Smoker    Packs/day: 0.25    Years: 10.00    Pack years: 2.50    Types: Cigarettes    Quit date: 2007    Years since quitting: 13.6  . Smokeless tobacco: Never Used  Substance Use Topics  . Alcohol use: No    Alcohol/week: 0.0 standard drinks    Comment: occasionally      Objective:   BP 109/75 (BP Location: Right Arm, Patient Position: Sitting, Cuff Size: Normal)   Pulse 92   Temp (!) 97.1 F (36.2 C) (Oral)   Wt 167 lb 6.4 oz (75.9 kg)   BMI 28.73 kg/m  Vitals:   12/29/18 1334  BP: 109/75  Pulse: 92  Temp: (!) 97.1 F (36.2 C)  TempSrc: Oral  Weight: 167 lb 6.4 oz (75.9 kg)     Physical Exam Vitals signs reviewed.  Constitutional:      General: She is not in acute distress.    Appearance: She is well-developed.  HENT:     Head: Normocephalic and atraumatic.  Eyes:     General: No scleral icterus.  Conjunctiva/sclera: Conjunctivae normal.  Cardiovascular:     Rate and Rhythm: Normal rate and regular rhythm.  Pulmonary:     Effort: Pulmonary effort is normal. No respiratory distress.  Skin:    General: Skin is warm and dry.     Capillary Refill: Capillary refill takes less than 2 seconds.     Findings: No rash.     Comments: 2 skin tags on L glute  Neurological:     Mental Status: She is alert and oriented to person, place, and time.  Psychiatric:        Behavior: Behavior normal.      No results found for any visits on 12/29/18.     Assessment & Plan   Problem List Items Addressed This Visit      Other   GAD (generalized anxiety disorder) - Primary    Chronic and fairly well controlled Exacerbated by husband's job as Emergency planning/management officerpolice officer Will increase Zoloft to 150mg  daily Discussed possible side effects Can  continue Klonopin sparingly F/u in 2 months Repeat PHQ9 and GAD7 at next visit Discussed return precautions and therapy      Relevant Medications   sertraline (ZOLOFT) 100 MG tablet   Major depressive disorder, recurrent (HCC)    Chronic and fairly well controlled Exacerbated by husband's job as Emergency planning/management officerpolice officer Will increase Zoloft to 150mg  daily Discussed possible side effects Can continue Klonopin sparingly F/u in 2 months Repeat PHQ9 and GAD7 at next visit Discussed return precautions and therapy      Relevant Medications   sertraline (ZOLOFT) 100 MG tablet    Other Visit Diagnoses    Encounter for routine checking of intrauterine contraceptive device (IUD)     - discussed all the benefits she is getting from IUD - no periods and improvement in PMDD - discussed that it does not contain estrogen and therefore does not carry risks of DVT and stroke like OCPs - offered removal, but patient decided to keep it - placed in 2016, so can wait until 2023 for replacement if continues to do well - discussed continuing with IUD through Menopause around age 42-55   Acquired skin tag     - Patient was given informed consent,. Appropriate time out was taken. Area prepped and draped in usual sterile fashion. 0.5 cc of 1% lidocaine without epinephrine was injected into the base of larger skin tag. Skin tag was elevated and removed with scissors.  Pressure applied.  The patient tolerated the procedure well. There were no complications. Post procedure instructions were given.        Return in about 2 months (around 02/28/2019) for MDD/GAD f/u.   The entirety of the information documented in the History of Present Illness, Review of Systems and Physical Exam were personally obtained by me. Portions of this information were initially documented by Presley RaddleNikki Walston, CMA and reviewed by me for thoroughness and accuracy.    Righteous Claiborne, Marzella SchleinAngela M, MD MPH [facility]

## 2018-12-29 NOTE — Assessment & Plan Note (Signed)
Chronic and fairly well controlled Exacerbated by husband's job as Engineer, structural Will increase Zoloft to 150mg  daily Discussed possible side effects Can continue Klonopin sparingly F/u in 2 months Repeat PHQ9 and GAD7 at next visit Discussed return precautions and therapy

## 2018-12-29 NOTE — Patient Instructions (Signed)
Skin Tag, Adult  A skin tag (acrochordon) is a soft, extra growth of skin. Most skin tags are flesh-colored and rarely bigger than a pencil eraser. They commonly form near areas where there are folds in the skin, such as the armpit or groin. Skin tags are not dangerous, and they do not spread from person to person (are not contagious). You may have one skin tag or several. Skin tags do not require treatment. However, your health care provider may recommend removal of a skin tag if it:  Gets irritated from clothing.  Bleeds.  Is visible and unsightly. Your health care provider can remove skin tags with a simple surgical procedure or a procedure that involves freezing the skin tag. Follow these instructions at home:  Watch for any changes in your skin tag. A normal skin tag does not require any other special care at home.  Take over-the-counter and prescription medicines only as told by your health care provider.  Keep all follow-up visits as told by your health care provider. This is important. Contact a health care provider if:  You have a skin tag that: ? Becomes painful. ? Changes color. ? Bleeds. ? Swells.  You develop more skin tags. This information is not intended to replace advice given to you by your health care provider. Make sure you discuss any questions you have with your health care provider. Document Released: 05/05/2015 Document Revised: 04/02/2017 Document Reviewed: 05/05/2015 Elsevier Patient Education  2020 Elsevier Inc.  

## 2018-12-29 NOTE — Assessment & Plan Note (Signed)
Chronic and fairly well controlled Exacerbated by husband's job as police officer Will increase Zoloft to 150mg daily Discussed possible side effects Can continue Klonopin sparingly F/u in 2 months Repeat PHQ9 and GAD7 at next visit Discussed return precautions and therapy 

## 2019-03-01 ENCOUNTER — Ambulatory Visit: Payer: BC Managed Care – PPO | Admitting: Family Medicine

## 2019-05-11 ENCOUNTER — Encounter: Payer: Self-pay | Admitting: Family Medicine

## 2019-05-12 ENCOUNTER — Encounter: Payer: BLUE CROSS/BLUE SHIELD | Admitting: Family Medicine

## 2019-05-23 ENCOUNTER — Encounter: Payer: Medicaid Other | Admitting: Family Medicine

## 2019-07-18 ENCOUNTER — Other Ambulatory Visit: Payer: Self-pay | Admitting: Family Medicine

## 2019-07-18 ENCOUNTER — Telehealth: Payer: Self-pay

## 2019-07-18 NOTE — Telephone Encounter (Signed)
Copied from CRM [id number]. Topic: General - Other >> Jul 18, 2019 12:31 PM Jaquita Rector A wrote: Reason for CRM: [facility] pharmacy called to say that the Rx written for sertraline (ZOLOFT) 100 MG tablet only covers 10 days and the patient would need 45 tabs to cover a 30 day period. Please advise

## 2019-07-18 NOTE — Telephone Encounter (Signed)
Requested Prescriptions  Pending Prescriptions Disp Refills  . sertraline (ZOLOFT) 100 MG tablet [Pharmacy Med Name: SERTRALINE HCL 100 MG TAB] 45 tablet 3    Sig: TAKE 1 AND 1/2 TABLETS (150 MG TOTAL) BYMOUTH DAILY     Psychiatry:  Antidepressants - SSRI Failed - 07/18/2019 11:10 AM      Failed - Completed PHQ-2 or PHQ-9 in the last 360 days.      Failed - Valid encounter within last 6 months    Recent Outpatient Visits          6 months ago GAD (generalized anxiety disorder)   [facility] [place], Marzella Schlein, MD   1 year ago Encounter for annual physical exam   [facility] [place], Marzella Schlein, MD   1 year ago Anxiety   [facility] [place], Marzella Schlein, MD   1 year ago Moderate episode of recurrent major depressive disorder [facility])   [facility], Marzella Schlein, MD

## 2019-07-18 NOTE — Telephone Encounter (Signed)
Called pharmacy to change prescription.

## 2019-08-04 ENCOUNTER — Encounter: Payer: Self-pay | Admitting: Family Medicine

## 2019-08-04 ENCOUNTER — Other Ambulatory Visit: Payer: Self-pay

## 2019-08-04 ENCOUNTER — Ambulatory Visit (INDEPENDENT_AMBULATORY_CARE_PROVIDER_SITE_OTHER): Payer: BC Managed Care – PPO | Admitting: Family Medicine

## 2019-08-04 VITALS — BP 113/78 | HR 69 | Temp 96.9°F | Ht 64.0 in | Wt 174.0 lb

## 2019-08-04 DIAGNOSIS — F411 Generalized anxiety disorder: Secondary | ICD-10-CM

## 2019-08-04 DIAGNOSIS — L658 Other specified nonscarring hair loss: Secondary | ICD-10-CM

## 2019-08-04 DIAGNOSIS — E782 Mixed hyperlipidemia: Secondary | ICD-10-CM | POA: Diagnosis not present

## 2019-08-04 DIAGNOSIS — Z8371 Family history of colonic polyps: Secondary | ICD-10-CM | POA: Insufficient documentation

## 2019-08-04 DIAGNOSIS — R739 Hyperglycemia, unspecified: Secondary | ICD-10-CM | POA: Diagnosis not present

## 2019-08-04 DIAGNOSIS — Z83719 Family history of colon polyps, unspecified: Secondary | ICD-10-CM | POA: Insufficient documentation

## 2019-08-04 DIAGNOSIS — Z1231 Encounter for screening mammogram for malignant neoplasm of breast: Secondary | ICD-10-CM

## 2019-08-04 DIAGNOSIS — Z0000 Encounter for general adult medical examination without abnormal findings: Secondary | ICD-10-CM | POA: Diagnosis not present

## 2019-08-04 DIAGNOSIS — E663 Overweight: Secondary | ICD-10-CM | POA: Insufficient documentation

## 2019-08-04 MED ORDER — SERTRALINE HCL 100 MG PO TABS: 150.0000 mg | ORAL_TABLET | Freq: Every day | ORAL | 1 refills | Status: DC

## 2019-08-04 MED ORDER — CLONAZEPAM 0.5 MG PO TABS: ORAL_TABLET | ORAL | 0 refills | Status: DC

## 2019-08-04 NOTE — Assessment & Plan Note (Signed)
Not on statin Repeat FLP and CMP 

## 2019-08-04 NOTE — Assessment & Plan Note (Signed)
Noted on last labs Check A1c 

## 2019-08-04 NOTE — Assessment & Plan Note (Signed)
Mother with polyps in her 47s Start colon cancer screening at age 43

## 2019-08-04 NOTE — Assessment & Plan Note (Signed)
Discussed importance of healthy weight management Discussed diet and exercise  

## 2019-08-04 NOTE — Progress Notes (Signed)
Complete physical exam    Patient: Laura Hamilton   DOB: 13-Dec-1975   44 y.o. Female  MRN: [id number] Visit Date: 08/04/2019  Today's healthcare provider: Shirlee Latch, MD  Subjective:    Chief Complaint  Patient presents with  . Annual Exam    Laura Hamilton is a 44 y.o. female who presents today for a complete physical exam. HPI  Recently got a wig for female pattern hair loss.  May look into  Getting an Rx in the future for tax purposes.  Past Medical History:  Diagnosis Date  . Anxiety   . Depression   . History of acne    s/p tx with accutane  . Keratosis pilaris   . Migraines   . Personal history of tobacco use   . PMDD (premenstrual dysphoric disorder)    Past Surgical History:  Procedure Laterality Date  . WISDOM TOOTH EXTRACTION     Social History   Socioeconomic History  . Marital status: Married    Spouse name: Not on file  . Number of children: 2  . Years of education: Not on file  . Highest education level: Not on file  Occupational History  . Not on file  Tobacco Use  . Smoking status: Former Smoker    Packs/day: 0.25    Years: 10.00    Pack years: 2.50    Types: Cigarettes    Quit date: 2007    Years since quitting: 14.2  . Smokeless tobacco: Never Used  Substance and Sexual Activity  . Alcohol use: No    Alcohol/week: 0.0 standard drinks    Comment: occasionally  . Drug use: No  . Sexual activity: Yes    Partners: Male    Birth control/protection: None, I.U.D.  Other Topics Concern  . Not on file  Social History Narrative  . Not on file   Social Determinants of Health   Financial Resource Strain:   . Difficulty of Paying Living Expenses:   Food Insecurity:   . Worried About Programme researcher, broadcasting/film/video in the Last Year:   . Barista in the Last Year:   Transportation Needs:   . Freight forwarder (Medical):   Marland Kitchen Lack of Transportation (Non-Medical):   Physical Activity:   . Days of Exercise per Week:   . Minutes of  Exercise per Session:   Stress:   . Feeling of Stress :   Social Connections:   . Frequency of Communication with Friends and Family:   . Frequency of Social Gatherings with Friends and Family:   . Attends Religious Services:   . Active Member of Clubs or Organizations:   . Attends Banker Meetings:   Marland Kitchen Marital Status:   Intimate Partner Violence:   . Fear of Current or Ex-Partner:   . Emotionally Abused:   Marland Kitchen Physically Abused:   . Sexually Abused:    Family Status  Relation Name Status  . Mother  Alive  . Father  Alive  . Sister  Alive  . Sister  Alive  . Sister  Alive  . Sister  Alive  . Other  (Not Specified)  . MGM  (Not Specified)  . Neg Hx  (Not Specified)   Family History  Problem Relation Age of Onset  . Colon polyps Mother   . Colitis Mother   . Anxiety disorder Sister   . Depression Sister   . Anxiety disorder Sister   . Depression Sister   .  Cancer Other        aunt-lymphoma  . Uterine cancer Maternal Grandmother   . Breast cancer Neg Hx   . Colon cancer Neg Hx    Allergies  Allergen Reactions  . Other Nausea Only    ALL NARCOTICS  . Paxil [Paroxetine Hcl]     Suicidal thoughts     Patient Care Team: Virginia Crews, MD as PCP - General (Family Medicine)   Medications: Outpatient Medications Prior to Visit  Medication Sig  . clonazePAM (KLONOPIN) 0.5 MG tablet TAKE 1 TABLET BY MOUTH TWICE A DAY AS NEEDED FOR ANXIETY  . meloxicam (MOBIC) 7.5 MG tablet Take 1 tablet (7.5 mg total) by mouth daily.  . sertraline (ZOLOFT) 100 MG tablet TAKE 1 AND 1/2 TABLETS (150 MG TOTAL) BYMOUTH DAILY  . [DISCONTINUED] hydrocortisone (ANUSOL-HC) 2.5 % rectal cream Place 1 application rectally 2 (two) times daily. (Patient not taking: Reported on 05/09/2018)  . [DISCONTINUED] hydrocortisone-pramoxine (PROCTOFOAM HC) rectal foam Place 1 applicator rectally 2 (two) times daily. (Patient not taking: Reported on 05/09/2018)   No facility-administered  medications prior to visit.    Review of Systems  Last CBC Lab Results  Component Value Date   WBC 7.7 05/12/2018   HGB 14.4 05/12/2018   HCT 41.1 05/12/2018   MCV 87 05/12/2018   MCH 30.6 05/12/2018   RDW 11.8 05/12/2018   PLT 274 16/02/9603   Last metabolic panel Lab Results  Component Value Date   GLUCOSE 100 (H) 05/12/2018   NA 142 05/12/2018   K 4.2 05/12/2018   CL 106 05/12/2018   CO2 21 05/12/2018   BUN 9 05/12/2018   CREATININE 0.62 05/12/2018   GFRNONAA 112 05/12/2018   GFRAA 130 05/12/2018   CALCIUM 9.3 05/12/2018   PROT 6.9 05/12/2018   ALBUMIN 4.5 05/12/2018   LABGLOB 2.4 05/12/2018   AGRATIO 1.9 05/12/2018   BILITOT 0.4 05/12/2018   ALKPHOS 68 05/12/2018   AST 9 05/12/2018   ALT 7 05/12/2018   ANIONGAP 11 06/21/2015   Last lipids Lab Results  Component Value Date   CHOL 186 05/12/2018   HDL 39 (L) 05/12/2018   LDLCALC 119 (H) 05/12/2018   LDLDIRECT 91.9 12/26/2012   TRIG 141 05/12/2018   CHOLHDL 4.8 (H) 05/12/2018   Last thyroid functions Lab Results  Component Value Date   TSH 1.870 05/12/2018        Objective:    BP 113/78 (BP Location: Left Arm, Patient Position: Sitting, Cuff Size: Large)   Pulse 69   Temp (!) 96.9 F (36.1 C) (Temporal)   Ht 5\' 4"  (1.626 m)   Wt 174 lb (78.9 kg)   BMI 29.87 kg/m  Wt Readings from Last 3 Encounters:  08/04/19 174 lb (78.9 kg)  12/29/18 167 lb 6.4 oz (75.9 kg)  05/09/18 172 lb 3.2 oz (78.1 kg)      Physical Exam Vitals reviewed.  Constitutional:      General: She is not in acute distress.    Appearance: Normal appearance. She is well-developed. She is not diaphoretic.  HENT:     Head: Normocephalic and atraumatic.     Right Ear: Tympanic membrane, ear canal and external ear normal.     Left Ear: Tympanic membrane, ear canal and external ear normal.  Eyes:     General: No scleral icterus.    Conjunctiva/sclera: Conjunctivae normal.     Pupils: Pupils are equal, round, and reactive to  light.  Neck:  Thyroid: No thyromegaly.  Cardiovascular:     Rate and Rhythm: Normal rate and regular rhythm.     Pulses: Normal pulses.     Heart sounds: Normal heart sounds. No murmur.  Pulmonary:     Effort: Pulmonary effort is normal. No respiratory distress.     Breath sounds: Normal breath sounds. No wheezing or rales.  Abdominal:     General: There is no distension.     Palpations: Abdomen is soft.     Tenderness: There is no abdominal tenderness.  Genitourinary:    Comments: Breasts: breasts appear normal, no suspicious masses, no skin or nipple changes or axillary nodes.  Musculoskeletal:        General: No deformity.     Cervical back: Neck supple.     Right lower leg: No edema.     Left lower leg: No edema.  Lymphadenopathy:     Cervical: No cervical adenopathy.  Skin:    General: Skin is warm and dry.     Capillary Refill: Capillary refill takes less than 2 seconds.     Findings: No rash.  Neurological:     Mental Status: She is alert and oriented to person, place, and time. Mental status is at baseline.  Psychiatric:        Mood and Affect: Mood normal.        Behavior: Behavior normal.        Thought Content: Thought content normal.       Depression Screen  PHQ 2/9 Scores 08/04/2019 05/09/2018 03/23/2018  PHQ - 2 Score 0 0 1  PHQ- 9 Score 0 4 3    No results found for any visits on 08/04/19.    Assessment & Plan:    Routine Health Maintenance and Physical Exam  Exercise Activities and Dietary recommendations Goals   None     Immunization History  Administered Date(s) Administered  . Influenza,inj,Quad PF,6+ Mos 01/27/2013, 04/03/2014, 05/13/2015, 01/26/2018, 12/29/2018  . Td 09/09/2005  . Tdap 09/11/2014    Health Maintenance  Topic Date Due  . INFLUENZA VACCINE  12/03/2019  . PAP SMEAR-Modifier  05/10/2023  . TETANUS/TDAP  09/10/2024  . HIV Screening  Completed    Discussed health benefits of physical activity, and encouraged her to  engage in regular exercise appropriate for her age and condition.   Problem List Items Addressed This Visit      Musculoskeletal and Integument   Female pattern hair loss    Looking into wig options Has been evaluated previously by Derm        Other   Hyperlipidemia    Not on statin Repeat FLP and CMP      Relevant Orders   Comprehensive metabolic panel   Lipid panel   GAD (generalized anxiety disorder)    Chronic and well controlled Continue Zoloft at current dose      Relevant Medications   sertraline (ZOLOFT) 100 MG tablet   Overweight    Discussed importance of healthy weight management Discussed diet and exercise       Family history of colonic polyps    Mother with polyps in her 30s Start colon cancer screening at age 28      Hyperglycemia    Noted on last labs Check A1c      Relevant Orders   Hemoglobin A1c    Other Visit Diagnoses    Encounter for annual physical exam    -  Primary   Relevant Orders   Comprehensive  metabolic panel   Lipid panel   CBC   TSH   Hemoglobin A1c   Breast cancer screening by mammogram       Relevant Orders   MM 3D SCREEN BREAST BILATERAL       Return in about 1 year (around 08/03/2020) for CPE.   The entirety of the information documented in the History of Present Illness, Review of Systems and Physical Exam were personally obtained by me. Portions of this information were initially documented by Kavin Leech, CMA and reviewed by me for thoroughness and accuracy.    Cressida Milford, Marzella Schlein, MD MPH [facility]

## 2019-08-04 NOTE — Assessment & Plan Note (Signed)
Looking into wig options Has been evaluated previously by [facility]

## 2019-08-04 NOTE — Assessment & Plan Note (Signed)
Chronic and well controlled Continue Zoloft at current dose 

## 2019-08-04 NOTE — Patient Instructions (Signed)

## 2019-08-21 DIAGNOSIS — E782 Mixed hyperlipidemia: Secondary | ICD-10-CM | POA: Diagnosis not present

## 2019-08-21 DIAGNOSIS — Z0000 Encounter for general adult medical examination without abnormal findings: Secondary | ICD-10-CM | POA: Diagnosis not present

## 2019-08-21 DIAGNOSIS — R739 Hyperglycemia, unspecified: Secondary | ICD-10-CM | POA: Diagnosis not present

## 2019-08-22 ENCOUNTER — Telehealth: Payer: Self-pay

## 2019-08-22 LAB — LIPID PANEL
Chol/HDL Ratio: 4.8 ratio — ABNORMAL HIGH (ref 0.0–4.4)
Cholesterol, Total: 223 mg/dL — ABNORMAL HIGH (ref 100–199)
HDL: 46 mg/dL (ref >39)
LDL Chol Calc (NIH): 157 mg/dL — ABNORMAL HIGH (ref 0–99)
Triglycerides: 112 mg/dL (ref 0–149)
VLDL Cholesterol Cal: 20 mg/dL (ref 5–40)

## 2019-08-22 LAB — COMPREHENSIVE METABOLIC PANEL
ALT: 12 IU/L (ref 0–32)
AST: 15 IU/L (ref 0–40)
Albumin/Globulin Ratio: 1.9 (ref 1.2–2.2)
Albumin: 4.9 g/dL — ABNORMAL HIGH (ref 3.8–4.8)
Alkaline Phosphatase: 84 IU/L (ref 39–117)
BUN/Creatinine Ratio: 20 (ref 9–23)
BUN: 12 mg/dL (ref 6–24)
Bilirubin Total: 0.6 mg/dL (ref 0.0–1.2)
CO2: 20 mmol/L (ref 20–29)
Calcium: 9.3 mg/dL (ref 8.7–10.2)
Chloride: 102 mmol/L (ref 96–106)
Creatinine, Ser: 0.61 mg/dL (ref 0.57–1.00)
GFR calc Af Amer: 128 mL/min/{1.73_m2} (ref >59)
GFR calc non Af Amer: 111 mL/min/{1.73_m2} (ref >59)
Globulin, Total: 2.6 g/dL (ref 1.5–4.5)
Glucose: 97 mg/dL (ref 65–99)
Potassium: 4.3 mmol/L (ref 3.5–5.2)
Sodium: 138 mmol/L (ref 134–144)
Total Protein: 7.5 g/dL (ref 6.0–8.5)

## 2019-08-22 LAB — HEMOGLOBIN A1C
Est. average glucose Bld gHb Est-mCnc: 111 mg/dL
Hgb A1c MFr Bld: 5.5 % (ref 4.8–5.6)

## 2019-08-22 LAB — CBC
Hematocrit: 46 % (ref 34.0–46.6)
Hemoglobin: 15.6 g/dL (ref 11.1–15.9)
MCH: 30.8 pg (ref 26.6–33.0)
MCHC: 33.9 g/dL (ref 31.5–35.7)
MCV: 91 fL (ref 79–97)
Platelets: 282 10*3/uL (ref 150–450)
RBC: 5.06 x10E6/uL (ref 3.77–5.28)
RDW: 11.9 % (ref 11.7–15.4)
WBC: 8.1 10*3/uL (ref 3.4–10.8)

## 2019-08-22 LAB — TSH: TSH: 1.33 u[IU]/mL (ref 0.450–4.500)

## 2019-08-22 NOTE — Telephone Encounter (Signed)
Pt advised.   Thanks,   -Mandie Crabbe  

## 2019-08-22 NOTE — Telephone Encounter (Signed)
-----   Message from Erasmo Downer, MD sent at 08/22/2019 11:45 AM EDT ----- Normal labs, except Cholesterol is high.  10 year risk of heart disease/stroke is low at 1%, though. No need for medications, but recommend diet low in saturated fat and regular exercise - 30 min at least 5 times per week

## 2019-12-26 ENCOUNTER — Other Ambulatory Visit: Payer: Self-pay | Admitting: Family Medicine

## 2020-04-05 ENCOUNTER — Other Ambulatory Visit: Payer: Self-pay | Admitting: Family Medicine

## 2020-04-05 ENCOUNTER — Ambulatory Visit
Admission: RE | Admit: 2020-04-05 | Discharge: 2020-04-05 | Disposition: A | Discharge status: Home / Self Care | Payer: BC Managed Care – PPO | Source: Ambulatory Visit | Attending: Family Medicine | Admitting: Family Medicine

## 2020-04-05 ENCOUNTER — Other Ambulatory Visit: Payer: Self-pay

## 2020-04-05 DIAGNOSIS — Z1231 Encounter for screening mammogram for malignant neoplasm of breast: Secondary | ICD-10-CM | POA: Insufficient documentation

## 2020-05-16 ENCOUNTER — Ambulatory Visit: Payer: BC Managed Care – PPO

## 2020-05-16 ENCOUNTER — Other Ambulatory Visit: Payer: Self-pay

## 2020-05-16 DIAGNOSIS — Z23 Encounter for immunization: Secondary | ICD-10-CM

## 2020-07-30 ENCOUNTER — Other Ambulatory Visit: Payer: Self-pay

## 2020-07-30 ENCOUNTER — Ambulatory Visit (LOCAL_COMMUNITY_HEALTH_CENTER): Payer: Self-pay

## 2020-07-30 DIAGNOSIS — Z111 Encounter for screening for respiratory tuberculosis: Secondary | ICD-10-CM

## 2020-08-02 ENCOUNTER — Other Ambulatory Visit: Payer: Self-pay

## 2020-08-02 ENCOUNTER — Ambulatory Visit (LOCAL_COMMUNITY_HEALTH_CENTER): Payer: Medicaid Other

## 2020-08-02 DIAGNOSIS — Z111 Encounter for screening for respiratory tuberculosis: Secondary | ICD-10-CM

## 2020-08-02 LAB — TB SKIN TEST
Induration: 11 mm
TB Skin Test: NEGATIVE

## 2020-08-02 NOTE — Progress Notes (Signed)
PPDR negative today, read at 11 mm. Needs PPD for job at school. 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 Jerel Shepherd, RN

## 2020-08-07 ENCOUNTER — Encounter: Payer: BC Managed Care – PPO | Admitting: Family Medicine

## 2020-08-20 ENCOUNTER — Encounter: Payer: BC Managed Care – PPO | Admitting: Family Medicine

## 2020-10-14 ENCOUNTER — Other Ambulatory Visit: Payer: Self-pay

## 2020-10-14 ENCOUNTER — Telehealth: Payer: BC Managed Care – PPO | Admitting: Medical

## 2020-10-14 DIAGNOSIS — R0982 Postnasal drip: Secondary | ICD-10-CM

## 2020-10-14 DIAGNOSIS — J069 Acute upper respiratory infection, unspecified: Secondary | ICD-10-CM

## 2020-10-14 DIAGNOSIS — J029 Acute pharyngitis, unspecified: Secondary | ICD-10-CM

## 2020-10-14 MED ORDER — AMOXICILLIN-POT CLAVULANATE 875-125 MG PO TABS: 1.0000 | ORAL_TABLET | Freq: Two times a day (BID) | ORAL | 0 refills | Status: AC

## 2020-10-14 NOTE — Patient Instructions (Signed)
Upper Respiratory Infection, Adult An upper respiratory infection (URI) affects the nose, throat, and upper air passages. URIs are caused by germs (viruses). The most common type of URI is often called "the common cold." Medicines cannot cure URIs, but you can do things at home to relieve yoursymptoms. URIs usually get better within 7-10 days. Follow these instructions at home: Activity Rest as needed. If you have a fever, stay home from work or school until your fever is gone, or until your doctor says you may return to work or school. You should stay home until you cannot spread the infection anymore (you are not contagious). Your doctor may have you wear a face mask so you have less risk of spreading the infection. Relieving symptoms Gargle with a salt-water mixture 3-4 times a day or as needed. To make a salt-water mixture, completely dissolve -1 tsp of salt in 1 cup of warm water. Use a cool-mist humidifier to add moisture to the air. This can help you breathe more easily. Eating and drinking  Drink enough fluid to keep your pee (urine) pale yellow. Eat soups and other clear broths.  General instructions  Take over-the-counter and prescription medicines only as told by your doctor. These include cold medicines, fever reducers, and cough suppressants. Do not use any products that contain nicotine or tobacco. These include cigarettes and e-cigarettes. If you need help quitting, ask your doctor. Avoid being where people are smoking (avoid secondhand smoke). Make sure you get regular shots and get the flu shot every year. Keep all follow-up visits as told by your doctor. This is important.  How to avoid spreading infection to others  Wash your hands often with soap and water. If you do not have soap and water, use hand sanitizer. Avoid touching your mouth, face, eyes, or nose. Cough or sneeze into a tissue or your sleeve or elbow. Do not cough or sneeze into your hand or into the  air.  Contact a doctor if: You are getting worse, not better. You have any of these: A fever. Chills. Brown or red mucus in your nose. Yellow or brown fluid (discharge)coming from your nose. Pain in your face, especially when you bend forward. Swollen neck glands. Pain with swallowing. White areas in the back of your throat. Get help right away if: You have shortness of breath that gets worse. You have very bad or constant: Headache. Ear pain. Pain in your forehead, behind your eyes, and over your cheekbones (sinus pain). Chest pain. You have long-lasting (chronic) lung disease along with any of these: Wheezing. Long-lasting cough. Coughing up blood. A change in your usual mucus. You have a stiff neck. You have changes in your: Vision. Hearing. Thinking. Mood. Summary An upper respiratory infection (URI) is caused by a germ called a virus. The most common type of URI is often called "the common cold." URIs usually get better within 7-10 days. Take over-the-counter and prescription medicines only as told by your doctor. This information is not intended to replace advice given to you by your health care provider. Make sure you discuss any questions you have with your healthcare provider. Document Revised: 12/28/2019 Document Reviewed: 12/28/2019 Elsevier Patient Education  2022 [employer]  Allergies, Adult An allergy means that your body reacts to something that bothers it (allergen). This can happen from something that you eat, breathe in, or touch. Allergies often affect the nose, eyes, skin, and stomach. They can be mild, moderate, or very bad (severe). An allergy cannot  spread from person to person. They can happen at any age.Sometimes, people outgrow them. What are the causes? Outdoor things, such as pollen, car fumes, and mold. Indoor things, such as dust, smoke, mold, and pets. Foods. Medicines. Things that bother your skin, such as perfume and bug bites. What  increases the risk? Having family members with allergies or asthma. What are the signs or symptoms? Symptoms depend on how bad your allergy is. Mild to moderate symptoms Runny nose, stuffy nose, or sneezing. Itchy mouth, ears, or throat. A feeling of mucus dripping down the back of your throat. Sore throat. Eyes that are itchy, red, watery, or puffy. A skin rash, or red, swollen areas of skin (hives). Stomach cramps or bloating. Severe symptoms Very bad allergies to food, medicine, or bug bites may cause a very bad allergy reaction (anaphylaxis). This can be life-threatening. Symptoms include: A red face. Wheezing or coughing. Swollen lips, tongue, or mouth. Tight or swollen throat. Chest pain or tightness, or a fast heartbeat. Trouble breathing or shortness of breath. Pain in your belly (abdomen), vomiting, or watery poop (diarrhea). Feeling dizzy or fainting. How is this treated?     Treatment for this condition depends on your symptoms. Treatment may include: Cold, wet cloths for itching and swelling. Eye drops, nose sprays, or skin creams. Washing out your nose each day. A humidifier. Medicines. A change to the foods you eat. Being exposed again and again to tiny amounts of allergens. This helps your body get used to them. You might have: Allergy shots. Very small amounts of allergen put under your tongue. An emergency shot (auto-injector pen) if you have a very bad allergy reaction. This is a medicine with a needle. You can put it into your skin by yourself. Your doctor will teach you how to use it. Follow these instructions at home: Medicines  Take or apply over-the-counter and prescription medicines only as told by your doctor. If you are at risk for a very bad allergy reaction, keep an auto-injector pen with you all the time.  Eating and drinking Follow instructions from your doctor about what to eat and drink. Drink enough fluid to keep your pee (urine) pale  yellow. General instructions If you have ever had a very bad allergy reaction, wear a medical alert bracelet or necklace. Stay away from things that you are allergic to. Keep all follow-up visits as told by your doctor. This is important. Contact a doctor if: Your symptoms do not get better with treatment. Get help right away if: You have symptoms of a very bad allergy reaction. These include: A swollen mouth, tongue, or throat. Pain or tightness in your chest. Trouble breathing. Being short of breath. Dizziness. Fainting. Very bad pain in your belly. Vomiting. Watery poop. These symptoms may be an emergency. Do not wait to see if the symptoms will go away. Get medical help right away. Call your local emergency services (911 in the [place] Do not drive yourself to the hospital. Summary Take or apply over-the-counter and prescription medicines only as told by your doctor. Stay away from things you are allergic to. If you are at risk for a very bad allergy reaction, carry an auto-injector pen all the time. Wear a medical alert bracelet or necklace. Very bad allergy reactions can be life-threatening. Get help right away. This information is not intended to replace advice given to you by your health care provider. Make sure you discuss any questions you have with your healthcare provider. Document  Revised: 03/01/2019 Document Reviewed: 03/01/2019 Elsevier Patient Education  2022 [employer]  Postnasal Drip Postnasal drip is the feeling of mucus going down the back of your throat. Mucus is a slimy substance that moistens and cleans your nose and throat, as well as the air pockets in face bones near your forehead and cheeks (sinuses). Small amounts of mucus pass from your nose and sinuses down the back of your throat all the time. This is normal. When you produce too much mucus or themucus gets too thick, you can feel it. Some common causes of postnasal drip include: Having more mucus  because of: A cold or the flu. Allergies. Cold air. Certain medicines. Having more mucus that is thicker because of: A sinus or nasal infection. Dry air. A food allergy. Follow these instructions at home: Relieving discomfort  Gargle with a salt-water mixture 3-4 times a day or as needed. To make a salt-water mixture, completely dissolve -1 tsp of salt in 1 cup of warm water. If the air in your home is dry, use a humidifier to add moisture to the air. Use a saline spray or container (neti pot) to flush out the nose (nasal irrigation). These methods can help clear away mucus and keep the nasal passages moist.  General instructions Take over-the-counter and prescription medicines only as told by your health care provider. Follow instructions from your health care provider about eating or drinking restrictions. You may need to avoid caffeine. Avoid things that you know you are allergic to (allergens), like dust, mold, pollen, pets, or certain foods. Drink enough fluid to keep your urine pale yellow. Keep all follow-up visits as told by your health care provider. This is important. Contact a health care provider if: You have a fever. You have a sore throat. You have difficulty swallowing. You have headache. You have sinus pain. You have a cough that does not go away. The mucus from your nose becomes thick and is green or yellow in color. You have cold or flu symptoms that last more than 10 days. Summary Postnasal drip is the feeling of mucus going down the back of your throat. If your health care provider approves, use nasal irrigation or a nasal spray 2?4 times a day. Avoid things that you know you are allergic to (allergens), like dust, mold, pollen, pets, or certain foods. This information is not intended to replace advice given to you by your health care provider. Make sure you discuss any questions you have with your healthcare provider. Document Revised: 01/30/2020 Document  Reviewed: 01/30/2020 Elsevier Patient Education  2022 [employer].

## 2020-10-14 NOTE — Progress Notes (Signed)
   Subjective:    Patient ID: Laura Hamilton, female    DOB: [birth year], 45 y.o.   MRN: [id number]  HPI  45 yo female in non acute distress consents to telemedicine appointment.Started Thursday and progressively worse.throat looked at by husband red.  Cough productive yellow.and clearing her thraot a lot.  Husband seen and clinic and diagnosed with pharyngitis possible strep POC test was negative.   Allergies  Allergen Reactions   Other Nausea Only    ALL NARCOTICS   Paxil [Paroxetine Hcl]     Suicidal thoughts      Review of Systems  Constitutional:  Positive for fatigue. Negative for chills and fever.  HENT:  Positive for postnasal drip, sneezing and sore throat. Negative for congestion, ear pain, nosebleeds, sinus pressure and sinus pain.   Respiratory:  Positive for cough. Negative for shortness of breath and wheezing.   Cardiovascular:  Negative for chest pain.  Gastrointestinal:  Negative for abdominal pain and diarrhea.  Musculoskeletal:  Negative for myalgias.  Skin:  Negative for color change.  Allergic/Immunologic: Negative for environmental allergies and food allergies.      Objective:   Physical Exam AXOx3 No distress noted on phone call. No physical exam was performed due to  telemedinicine appointment.  Clearing throat a lot on phone call.     Assessment & Plan:  Pharyngitis Upper respiratory infection Post nasal drip. Meds ordered this encounter  Medications   amoxicillin-clavulanate (AUGMENTIN) 875-125 MG tablet    Sig: Take 1 tablet by mouth 2 (two) times daily for 10 days. Take with food    Dispense:  20 tablet    Refill:  0   Start OTC Zyrtec or Claritin, and OTC Flonase per package instructions.  Call if not improving in 3-5 days or if worsening. Patient verbalizes understanding and has no questions at discharge. Patient verbalizes understanding and has no questions at the end of our conversation.

## 2020-10-17 ENCOUNTER — Other Ambulatory Visit: Payer: Self-pay | Admitting: Family Medicine

## 2020-10-17 NOTE — Telephone Encounter (Signed)
Appointment 11/08/20- courtesy RF #[id number] given

## 2020-11-05 ENCOUNTER — Telehealth: Payer: BC Managed Care – PPO | Admitting: Physician Assistant

## 2020-11-05 DIAGNOSIS — N898 Other specified noninflammatory disorders of vagina: Secondary | ICD-10-CM | POA: Diagnosis not present

## 2020-11-05 MED ORDER — FLUCONAZOLE 150 MG PO TABS: 150.0000 mg | ORAL_TABLET | Freq: Once | ORAL | 0 refills | Status: AC

## 2020-11-05 NOTE — Progress Notes (Signed)

## 2020-11-05 NOTE — Progress Notes (Signed)
I have spent 5 minutes in review of e-visit questionnaire, review and updating patient chart, medical decision making and response to patient.   Darivs Lunden Cody Elvera Almario, PA-C    

## 2020-11-05 NOTE — Progress Notes (Signed)
Sometimes the e prescribing system does not always communicate with the pharmacies.   The medication was resent. You should be able to pick it up.   You can always call your pharmacy before heading over to make sure they have the prescription this time.   Sorry for the inconvenience,  Daiva Nakayama, PA-C

## 2020-11-05 NOTE — Addendum Note (Signed)
Addended by: Margaretann Loveless on: 11/05/2020 12:26 PM   Modules accepted: Orders

## 2020-11-08 ENCOUNTER — Encounter: Payer: BC Managed Care – PPO | Admitting: Family Medicine

## 2020-11-19 ENCOUNTER — Other Ambulatory Visit: Payer: Self-pay | Admitting: Family Medicine

## 2020-11-20 MED ORDER — CLONAZEPAM 0.5 MG PO TABS: ORAL_TABLET | ORAL | 0 refills | Status: DC

## 2020-11-20 MED ORDER — SERTRALINE HCL 100 MG PO TABS: 150.0000 mg | ORAL_TABLET | Freq: Every day | ORAL | 0 refills | Status: AC

## 2021-02-04 ENCOUNTER — Other Ambulatory Visit: Payer: Self-pay | Admitting: Family Medicine

## 2021-02-04 DIAGNOSIS — Z0000 Encounter for general adult medical examination without abnormal findings: Secondary | ICD-10-CM | POA: Diagnosis not present

## 2021-02-04 DIAGNOSIS — F419 Anxiety disorder, unspecified: Secondary | ICD-10-CM | POA: Diagnosis not present

## 2021-02-04 DIAGNOSIS — Z1231 Encounter for screening mammogram for malignant neoplasm of breast: Secondary | ICD-10-CM | POA: Diagnosis not present

## 2021-02-04 DIAGNOSIS — Z23 Encounter for immunization: Secondary | ICD-10-CM | POA: Diagnosis not present

## 2021-02-04 DIAGNOSIS — Z130 Encounter for screening for diseases of the blood and blood-forming organs and certain disorders involving the immune mechanism: Secondary | ICD-10-CM | POA: Diagnosis not present

## 2021-02-12 DIAGNOSIS — Z13220 Encounter for screening for lipoid disorders: Secondary | ICD-10-CM | POA: Diagnosis not present

## 2021-02-12 DIAGNOSIS — Z131 Encounter for screening for diabetes mellitus: Secondary | ICD-10-CM | POA: Diagnosis not present

## 2021-02-25 ENCOUNTER — Other Ambulatory Visit: Payer: Self-pay

## 2021-02-25 ENCOUNTER — Ambulatory Visit: Payer: BC Managed Care – PPO | Admitting: Medical

## 2021-02-25 DIAGNOSIS — R0982 Postnasal drip: Secondary | ICD-10-CM

## 2021-02-25 DIAGNOSIS — R051 Acute cough: Secondary | ICD-10-CM

## 2021-02-25 MED ORDER — BENZONATATE 100 MG PO CAPS: ORAL_CAPSULE | ORAL | 0 refills | Status: AC

## 2021-02-25 NOTE — Progress Notes (Signed)
   Subjective:    Patient ID: Laura Hamilton, female    DOB: Sep 18, 1975, 45 y.o.   MRN: [id number]  HPI 45 yo female in non acute distress consents to telemedicine appointment.complains of  Cough.  Started not feeling well on Wednesday , runny nose , took a Covid-19  yesterday and was negative.   Family NP listened  to her breath sounds , she said she  sounded good  and she  also suggested Tussionex. Complains of tickle in the back of her throat.  Has tried the following : Mucinex DM ( dextromethorphan and guanfacine),  Sudafed,  Robitussin and Nyquill and Dayquil, none have helped.  She would like Tussionex.  Allergies  Allergen Reactions   Other Nausea Only    ALL NARCOTICS   Paxil [Paroxetine Hcl]     Suicidal thoughts     Review of Systems  Constitutional:  Negative for chills and fever.  Respiratory:  Positive for cough (rine leakage with coughing mild  productive clear and white green in the am.) and shortness of breath.   Cardiovascular:  Positive for chest pain ("muscle aches from coughing").  Neurological:  Positive for headaches (due tocoughing).      Objective:   Physical Exam AXOX3 No acute distress noted able to speak in full sentences. Cough on phone call noted. Hoarse voice.  Takes Klonopin as needed,  "not on a daily basis".    Assessment & Plan:  Cough, will try first line of Benzonatate. Possibly Covid infection Recommended OTC Zyrtec  daily , to help dry up Post nasal drip. Meds ordered this encounter  Medications   benzonatate (TESSALON PERLES) 100 MG capsule    Sig: 1-2 capsules , swallow whole every 8 hours as needed for cough.    Dispense:  30 capsule    Refill:  0  Recommended taking teaspoon of honey to help sooth the cough. Follow up with your PCP if Benzonatate does not calm down the cough. Patient verbalizes understanding and has no questions at the end of our conversation.

## 2021-03-07 DIAGNOSIS — D2261 Melanocytic nevi of right upper limb, including shoulder: Secondary | ICD-10-CM | POA: Diagnosis not present

## 2021-03-07 DIAGNOSIS — D2262 Melanocytic nevi of left upper limb, including shoulder: Secondary | ICD-10-CM | POA: Diagnosis not present

## 2021-03-07 DIAGNOSIS — D2362 Other benign neoplasm of skin of left upper limb, including shoulder: Secondary | ICD-10-CM | POA: Diagnosis not present

## 2021-03-07 DIAGNOSIS — D225 Melanocytic nevi of trunk: Secondary | ICD-10-CM | POA: Diagnosis not present

## 2021-03-07 DIAGNOSIS — D2272 Melanocytic nevi of left lower limb, including hip: Secondary | ICD-10-CM | POA: Diagnosis not present

## 2021-03-07 DIAGNOSIS — D485 Neoplasm of uncertain behavior of skin: Secondary | ICD-10-CM | POA: Diagnosis not present

## 2021-04-09 ENCOUNTER — Other Ambulatory Visit: Payer: Self-pay

## 2021-04-09 ENCOUNTER — Ambulatory Visit
Admission: RE | Admit: 2021-04-09 | Discharge: 2021-04-09 | Disposition: A | Discharge status: Home / Self Care | Payer: BC Managed Care – PPO | Source: Ambulatory Visit | Attending: Family Medicine | Admitting: Family Medicine

## 2021-04-09 DIAGNOSIS — Z1231 Encounter for screening mammogram for malignant neoplasm of breast: Secondary | ICD-10-CM | POA: Insufficient documentation

## 2021-05-27 ENCOUNTER — Encounter: Payer: BC Managed Care – PPO | Admitting: Family Medicine

## 2022-02-05 ENCOUNTER — Other Ambulatory Visit: Payer: Self-pay | Admitting: Family Medicine

## 2022-02-05 DIAGNOSIS — Z1231 Encounter for screening mammogram for malignant neoplasm of breast: Secondary | ICD-10-CM

## 2022-07-07 ENCOUNTER — Ambulatory Visit: Payer: BC Managed Care – PPO

## 2022-07-07 DIAGNOSIS — K640 First degree hemorrhoids: Secondary | ICD-10-CM | POA: Diagnosis not present

## 2022-07-07 DIAGNOSIS — Z83719 Family history of colon polyps, unspecified: Secondary | ICD-10-CM | POA: Diagnosis not present

## 2022-07-07 DIAGNOSIS — Z1211 Encounter for screening for malignant neoplasm of colon: Secondary | ICD-10-CM | POA: Diagnosis present

## 2022-08-04 ENCOUNTER — Other Ambulatory Visit: Payer: Self-pay | Admitting: Otolaryngology

## 2022-08-04 DIAGNOSIS — H9312 Tinnitus, left ear: Secondary | ICD-10-CM

## 2022-08-26 ENCOUNTER — Ambulatory Visit
Admission: RE | Admit: 2022-08-26 | Discharge: 2022-08-26 | Disposition: A | Discharge status: Home / Self Care | Payer: BC Managed Care – PPO | Source: Ambulatory Visit | Attending: Otolaryngology | Admitting: Otolaryngology

## 2022-08-26 DIAGNOSIS — H9312 Tinnitus, left ear: Secondary | ICD-10-CM

## 2022-08-26 MED ORDER — GADOPICLENOL 0.5 MMOL/ML IV SOLN
7.5000 mL | Freq: Once | INTRAVENOUS | Status: AC | PRN
  Administered 2022-08-26: 7.5 mL via INTRAVENOUS

## 2023-02-11 ENCOUNTER — Other Ambulatory Visit: Payer: Self-pay | Admitting: Family Medicine

## 2023-02-11 DIAGNOSIS — Z1231 Encounter for screening mammogram for malignant neoplasm of breast: Secondary | ICD-10-CM

## 2023-04-24 IMAGING — MG MM DIGITAL SCREENING BILAT W/ TOMO AND CAD
8 series · 8 of 24 positions shown · non-contrast
Comparison: Previous exam(s).

CLINICAL DATA: Screening.

EXAM:
DIGITAL SCREENING BILATERAL MAMMOGRAM WITH TOMOSYNTHESIS AND CAD
TECHNIQUE: Bilateral screening digital craniocaudal and mediolateral oblique
mammograms were obtained. Bilateral screening digital breast
tomosynthesis was performed. The images were evaluated with
computer-aided detection.

[R MLO synth-2D]
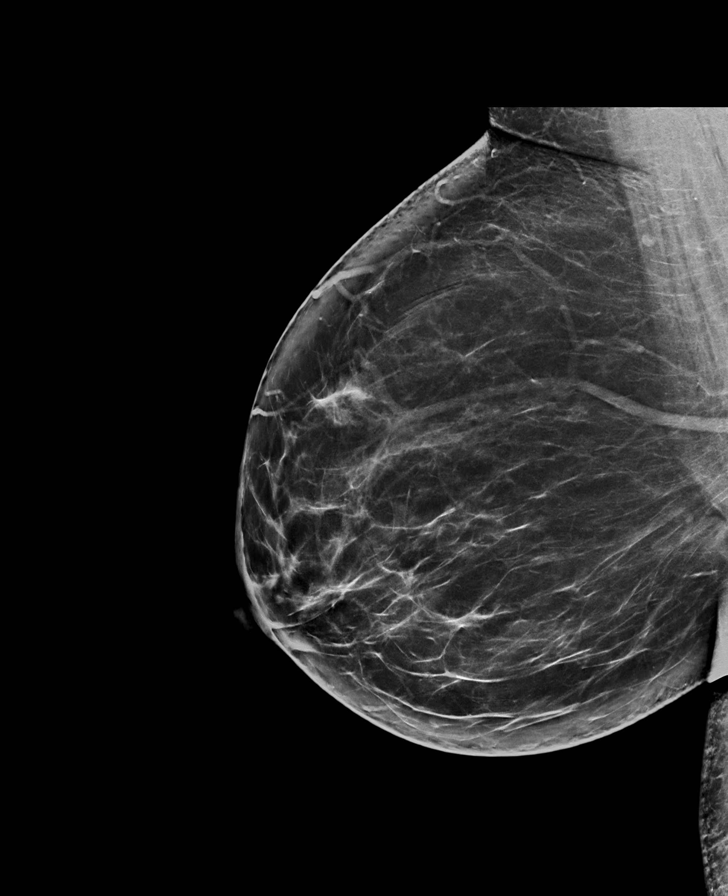

[L CC synth-2D]
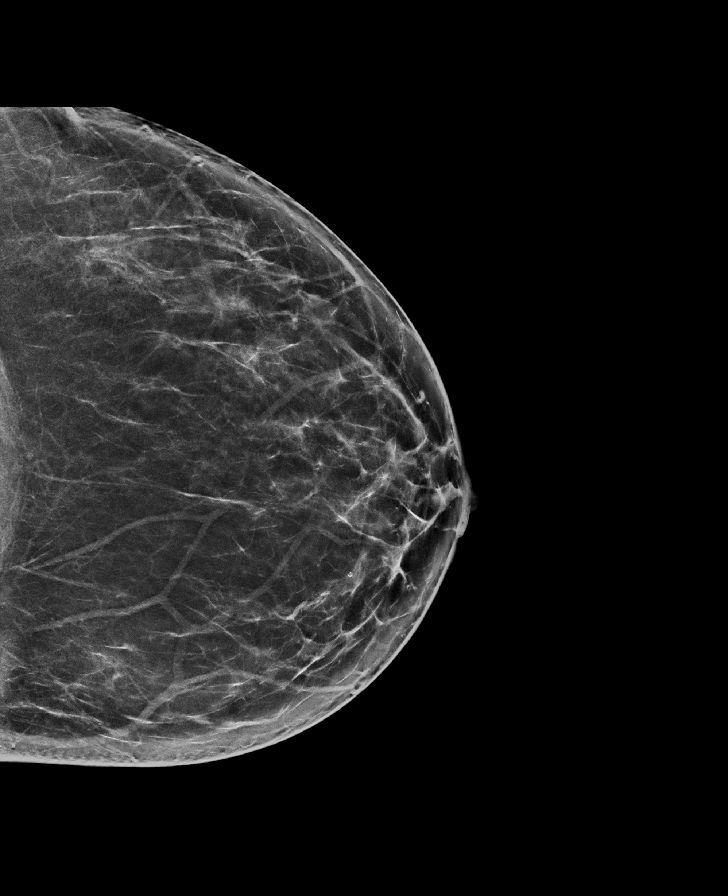

[R CC synth-2D]
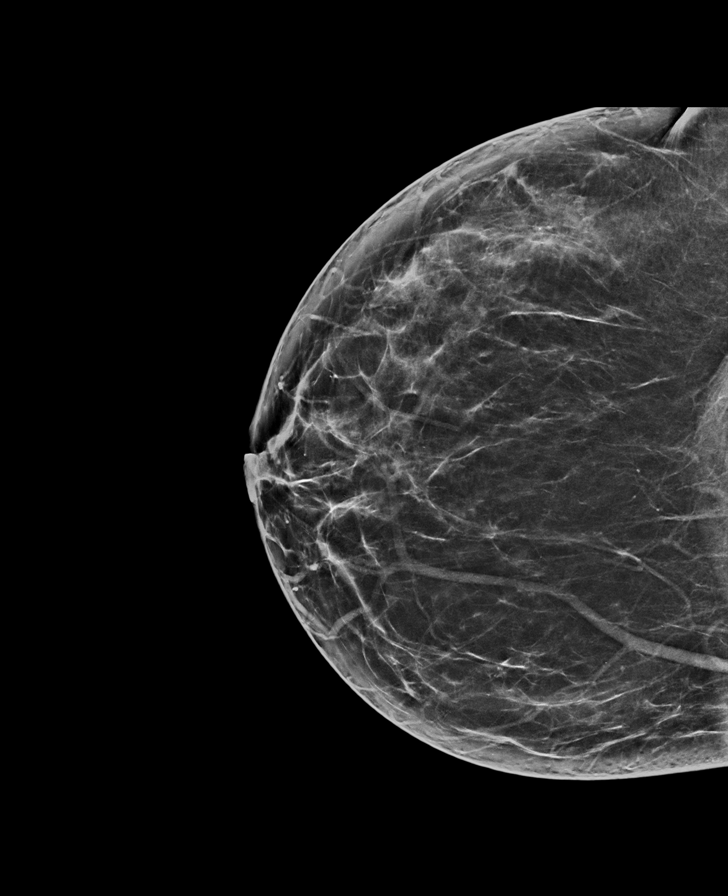

[L MLO synth-2D]
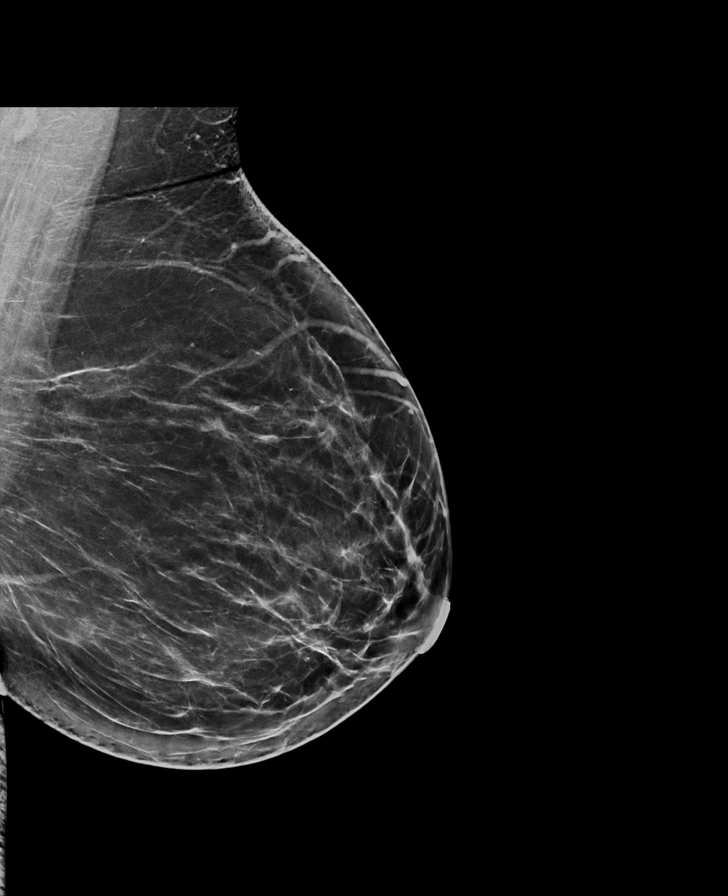

[L CC tomo · tomo slice 39/77.0]
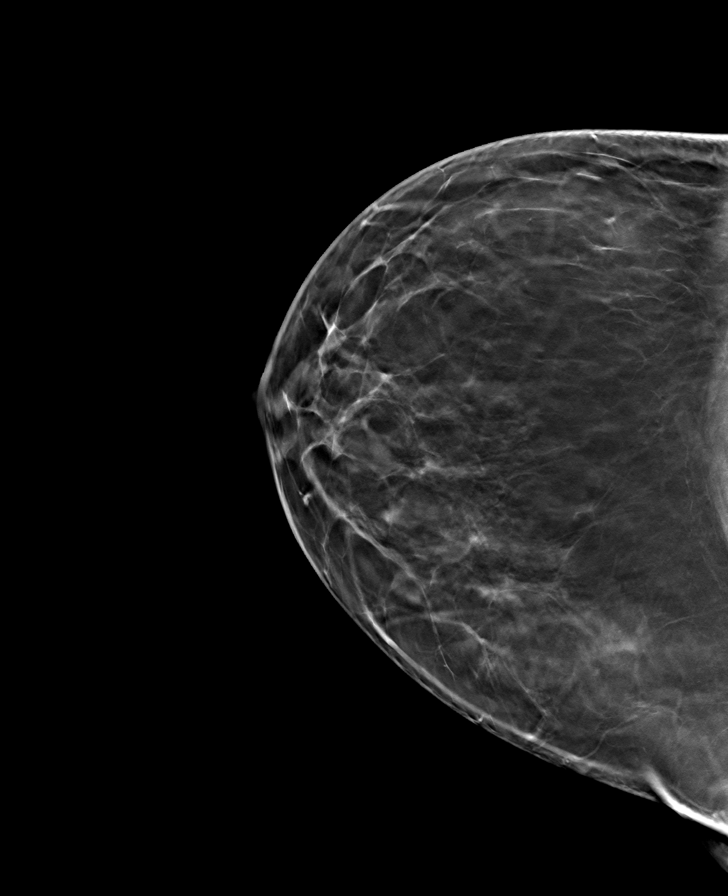

[R CC tomo · tomo slice 37/74.0]
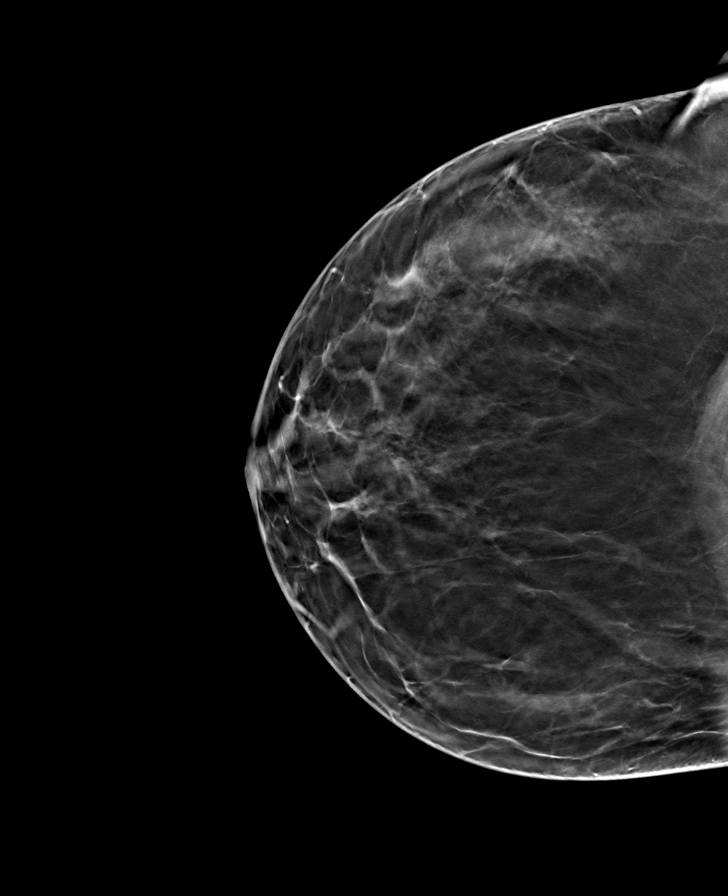

[L MLO tomo · tomo slice 45/88.0]
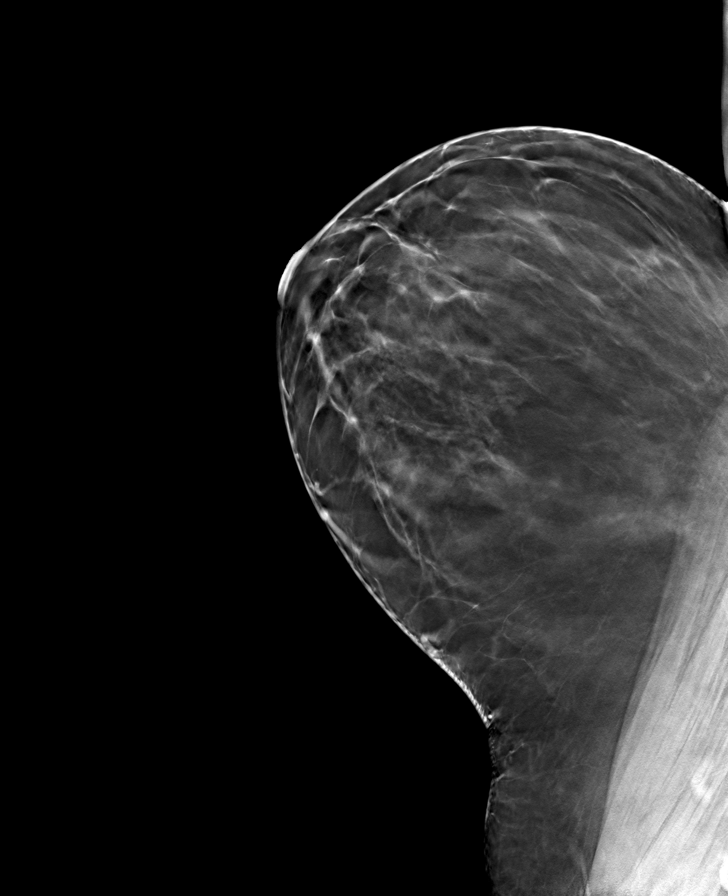

[R MLO tomo · tomo slice 45/89.0]
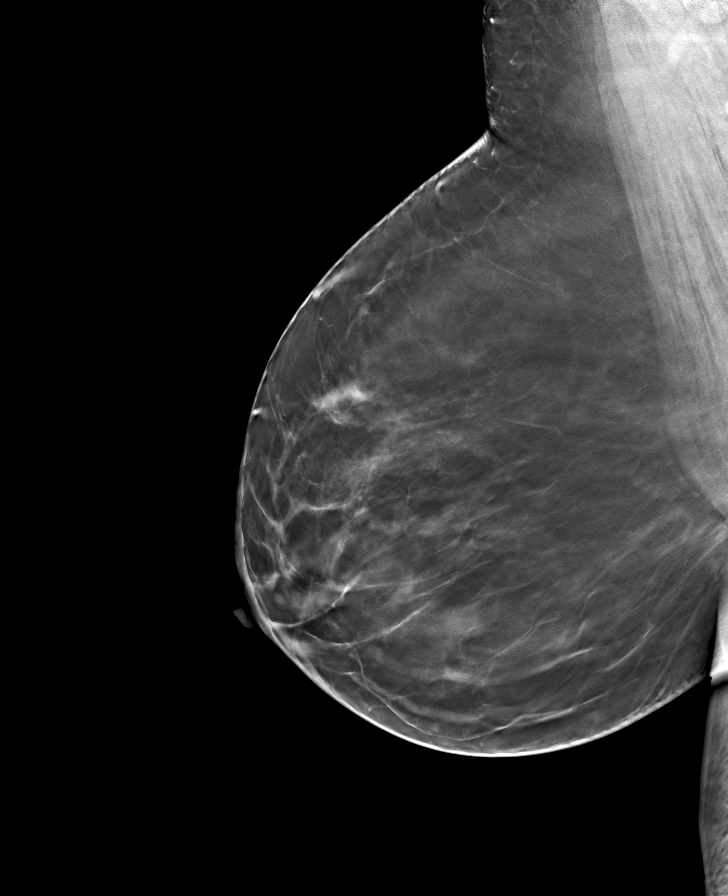

[8 of 24 positions shown; findings below may reference images not displayed]

ACR Breast Density Category b: There are scattered areas of
fibroglandular density.
FINDINGS: There are no findings suspicious for malignancy.
IMPRESSION: No mammographic evidence of malignancy. A result letter of this
screening mammogram will be mailed directly to the patient.

RECOMMENDATION:
Screening mammogram in one year. (Code:51-O-LD2)

BI-RADS CATEGORY  1: Negative.

## 2023-06-07 ENCOUNTER — Ambulatory Visit
Admission: RE | Admit: 2023-06-07 | Discharge: 2023-06-07 | Disposition: A | Discharge status: Home / Self Care | Payer: BC Managed Care – PPO | Source: Ambulatory Visit | Attending: Family Medicine | Admitting: Family Medicine

## 2023-06-07 DIAGNOSIS — Z1231 Encounter for screening mammogram for malignant neoplasm of breast: Secondary | ICD-10-CM | POA: Diagnosis present
